# Patient Record
Sex: Male | Born: 1960 | Race: White | Hispanic: No | Marital: Married | Smoking: Never smoker
Health system: Southern US, Community
[De-identification: ages and names within clinical notes are randomized; demographics above are authoritative.]

## PROBLEM LIST (undated history)

## (undated) DIAGNOSIS — E669 Obesity, unspecified: Secondary | ICD-10-CM

## (undated) DIAGNOSIS — C801 Malignant (primary) neoplasm, unspecified: Secondary | ICD-10-CM

## (undated) DIAGNOSIS — S0530XA Ocular laceration without prolapse or loss of intraocular tissue, unspecified eye, initial encounter: Secondary | ICD-10-CM

## (undated) HISTORY — PX: EYE SURGERY: SHX253

## (undated) HISTORY — DX: Obesity, unspecified: E66.9

## (undated) HISTORY — DX: Malignant (primary) neoplasm, unspecified: C80.1

## (undated) HISTORY — DX: Ocular laceration without prolapse or loss of intraocular tissue, unspecified eye, initial encounter: S05.30XA

## (undated) HISTORY — PX: OTHER SURGICAL HISTORY: SHX169

---
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
## 2007-03-14 ENCOUNTER — Emergency Department: Payer: Self-pay | Admitting: Emergency Medicine

## 2008-12-05 ENCOUNTER — Emergency Department: Payer: Self-pay | Admitting: Emergency Medicine

## 2008-12-15 ENCOUNTER — Ambulatory Visit: Payer: Self-pay | Admitting: Emergency Medicine

## 2009-01-02 ENCOUNTER — Ambulatory Visit: Payer: Self-pay | Admitting: Pain Medicine

## 2009-01-19 ENCOUNTER — Ambulatory Visit: Payer: Self-pay | Admitting: Physician Assistant

## 2010-07-27 ENCOUNTER — Ambulatory Visit: Payer: Self-pay | Admitting: Unknown Physician Specialty

## 2010-07-27 LAB — HM COLONOSCOPY

## 2011-04-01 ENCOUNTER — Ambulatory Visit: Payer: Self-pay | Admitting: General Practice

## 2012-10-21 HISTORY — PX: TOTAL HIP ARTHROPLASTY: SHX124

## 2013-02-17 ENCOUNTER — Ambulatory Visit: Payer: Self-pay | Admitting: Pain Medicine

## 2013-02-18 ENCOUNTER — Ambulatory Visit: Payer: Self-pay | Admitting: Pain Medicine

## 2013-03-04 ENCOUNTER — Emergency Department: Payer: Self-pay | Admitting: Emergency Medicine

## 2013-03-05 ENCOUNTER — Ambulatory Visit: Payer: Self-pay | Admitting: Pain Medicine

## 2013-03-31 ENCOUNTER — Ambulatory Visit: Payer: Self-pay | Admitting: Pain Medicine

## 2013-04-13 ENCOUNTER — Ambulatory Visit: Payer: Self-pay | Admitting: Pain Medicine

## 2013-05-31 ENCOUNTER — Ambulatory Visit: Payer: Self-pay | Admitting: General Practice

## 2013-05-31 LAB — CBC
HCT: 39.2 % — ABNORMAL LOW (ref 40.0–52.0)
MCH: 32.1 pg (ref 26.0–34.0)
MCHC: 35.3 g/dL (ref 32.0–36.0)
MCV: 91 fL (ref 80–100)
Platelet: 164 10*3/uL (ref 150–440)
RBC: 4.31 10*6/uL — ABNORMAL LOW (ref 4.40–5.90)
RDW: 12.6 % (ref 11.5–14.5)
WBC: 6.4 10*3/uL (ref 3.8–10.6)

## 2013-05-31 LAB — BASIC METABOLIC PANEL
Chloride: 107 mmol/L (ref 98–107)
Co2: 28 mmol/L (ref 21–32)
EGFR (African American): >60
EGFR (Non-African Amer.): >60
Glucose: 93 mg/dL (ref 65–99)
Osmolality: 279 (ref 275–301)
Potassium: 4 mmol/L (ref 3.5–5.1)

## 2013-05-31 LAB — URINALYSIS, COMPLETE
Blood: NEGATIVE
Ph: 5 (ref 4.5–8.0)

## 2013-05-31 LAB — PROTIME-INR: Prothrombin Time: 13.1 secs (ref 11.5–14.7)

## 2013-05-31 LAB — MRSA PCR SCREENING

## 2013-06-14 ENCOUNTER — Inpatient Hospital Stay: Payer: Self-pay | Admitting: General Practice

## 2013-06-15 LAB — BASIC METABOLIC PANEL WITH GFR
Anion Gap: 5 — ABNORMAL LOW
BUN: 9 mg/dL
Calcium, Total: 8.4 mg/dL — ABNORMAL LOW
Chloride: 105 mmol/L
Co2: 26 mmol/L
Creatinine: 0.91 mg/dL
EGFR (African American): >60
EGFR (Non-African Amer.): >60
Glucose: 124 mg/dL — ABNORMAL HIGH
Osmolality: 272
Potassium: 4.3 mmol/L
Sodium: 136 mmol/L

## 2013-06-15 LAB — HEMOGLOBIN: HGB: 11.9 g/dL — ABNORMAL LOW (ref 13.0–18.0)

## 2013-06-15 LAB — PLATELET COUNT: Platelet: 178 10*3/uL (ref 150–440)

## 2013-06-16 LAB — BASIC METABOLIC PANEL WITH GFR
Anion Gap: 5 — ABNORMAL LOW
BUN: 11 mg/dL
Calcium, Total: 8.7 mg/dL
Chloride: 102 mmol/L
Co2: 27 mmol/L
Creatinine: 0.95 mg/dL
EGFR (African American): >60
EGFR (Non-African Amer.): >60
Glucose: 105 mg/dL — ABNORMAL HIGH
Osmolality: 268
Potassium: 4.2 mmol/L
Sodium: 134 mmol/L — ABNORMAL LOW

## 2013-06-16 LAB — PATHOLOGY REPORT

## 2013-06-16 LAB — HEMOGLOBIN: HGB: 12.4 g/dL — ABNORMAL LOW

## 2014-07-31 ENCOUNTER — Emergency Department: Payer: Self-pay | Admitting: Emergency Medicine

## 2015-02-10 NOTE — Discharge Summary (Signed)
PATIENT NAME:  Thomas Durham, Thomas Durham MR#:  [id number] DATE OF BIRTH:  05/30/1961  DATE OF ADMISSION:  06/14/2013 DATE OF DISCHARGE:  06/16/2013   DICTATING FOR: Jeneen Rinks P. Holley Bouche., MD  ADMITTING DIAGNOSIS: Degenerative arthrosis of right hip.   DISCHARGE DIAGNOSIS: Degenerative arthrosis of right hip.   HISTORY: The patient is a pleasant 54 year old gentleman who has been followed at [facility] for progression of right hip pain. He had a chronic history of low back pain with radicular symptoms and has been treated by Dr. Dossie Arbour with epidural steroid injections. Recently, he has been having increased right hip pain for which the epidural steroid injections did not give any relief. MRI was performed on the right hip, which subsequently showed findings consistent with severe avascular necrosis as well as collapse of the superior acetabulum at the articular surface. The patient was noted to have milder changes on the MRI of avascular necrosis to the right femoral head. The patient had reported progressive difficulty with his right hip range of motion and was having problems with doffing and donning of his socks. Arising from a seated position tended to aggravate his pain. He was reporting night pain as well. He had not seen any significant improvement in his condition despite nonsteroidal anti-inflammatory medications as well as activity modification. His pain had increased to the point that it was significantly interfering with his activities of daily living. X-rays taken in [facility] showed cystic changes noted to the femoral head with flattening of the superior portion of the femoral head. He was also noted to have significant narrowing of the cartilage space. After discussion of the risks and benefits of surgical intervention, the patient expressed his understanding of the risks and benefits and agreed for plans for surgical intervention.   PROCEDURE: Right total hip arthroplasty.   ANESTHESIA:  Spinal.   IMPLANTS UTILIZED: DePuy 13.5 mm small stature AML femoral component, 54 mm outer diameter Pinnacle 100 acetabular component, +4 mm neutral Pinnacle Marathon polyethylene liner, 36 mm M-Spec femoral head with a +1.5 mm neck length.   HOSPITAL COURSE: The patient tolerated the procedure very well. He had no complications. He was then taken to the PACU, where he was stabilized and then transferred to the orthopedic floor. The patient began receiving anticoagulation therapy of Lovenox 30 mg subcutaneous q.12 hours per anesthesia and pharmacy protocol. He was fitted with TED stockings bilaterally. These were allowed to be removed 1 hour per 8-hour shift. He was also fitted with the AVI compression foot pumps bilaterally set at 80 mmHg. His calves have been nontender. There has been no evidence of any DVTs. Negative Homans sign. Heels were elevated off the bed using rolled towels.   The patient has denied any chest pain or any shortness of breath. Vital signs have been stable. He has been afebrile. Hemodynamically, he is stable. No transfusions were given.   Physical therapy was initiated on day 1 for gait training and transfers. On day 1, he was ambulating 150 feet. Upon being discharged, he was ambulating greater than 200 feet. He was able go up and down 4 sets of steps. He was independent with bed-to-chair transfers. He could recall the 4 precautions of posterior hip precautions. Occupational therapy was also initiated on day 1 for ADLs and assistive devices.   The patient is discharged to home in improved stable condition.   DISCHARGE INSTRUCTIONS:  1. He may weight bear as tolerated. Continue using a walker until cleared by physical therapy to  go to a quad cane.  2. He will receive home health PT.  3. Elevate the heels off the bed.  4. Continue with TED stockings. These are to be worn during the day, but may be removed at night. Incentive spirometer q.1 hour while awake.  5. He was placed  on a regular diet.  6. He is not to get the dressing wet. He is not to take a shower until the staples are removed in 2 weeks on 06/28/2013.  7. He is to call the clinic sooner if any temperatures of 101.5 or greater or excessive bleeding. 8. He has a followup appointment in [facility] on October 7th at 2:45 with Dr. Skip Estimable.  9. The patient is to resume his regular medications he was on prior to admission. He was given a prescription for oxycodone 5 to 10 mg q.4-6 hours p.r.n. for pain and Lovenox 40 mg subcutaneously daily for 14 days, then discontinue and begin taking one 81 mg enteric-coated aspirin.   PAST MEDICAL HISTORY: Chronic low back pain.   ____________________________ Vance Peper, PA jrw:OSi D: 06/16/2013 06:32:44 ET T: 06/16/2013 07:20:28 ET JOB#: [id number]  cc: Vance Peper, PA, <Dictator> Ziaire Bieser PA ELECTRONICALLY SIGNED 06/17/2013 22:08

## 2015-02-10 NOTE — Op Note (Signed)
PATIENT NAME:  Thomas Durham, Thomas Durham MR#:  [id number] DATE OF BIRTH:  1960-12-16  DATE OF PROCEDURE:  06/14/2013  PREOPERATIVE DIAGNOSIS: Degenerative arthrosis of the right hip secondary to avascular necrosis of the right femoral head.   POSTOPERATIVE DIAGNOSIS: Degenerative arthrosis of the right hip secondary to avascular necrosis of the right femoral head (pathology pending).   PROCEDURE PERFORMED: Right total hip arthroplasty.   SURGEON: Laurice Record. Hooten, M.D.   ASSISTANT:  Vance Peper, PA (Required to maintain retraction throughout the procedure.)   ANESTHESIA: Spinal.   ESTIMATED BLOOD LOSS: 200 mL.   FLUIDS REPLACED: 2000 mL of crystalloid.   DRAINS: Two medium drains to Hemovac reservoir.   IMPLANTS UTILIZED: DePuy 13.5 mm small stature AML femoral stem, 54 mm outer diameter Pinnacle 100 acetabular component, +4 mm neutral Pinnacle Marathon polyethylene liner, 36 mm M-Spec femoral head with a +1.5 mm neck length.   INDICATIONS FOR SURGERY: The patient is a 54 year old gentleman who has been seen for complaints of progressive right hip and groin pain. Radiographs demonstrated significant degenerative changes with evidence of avascular necrosis of the femoral head. He did not see any significant improvement despite conservative nonsurgical intervention. After discussion of the risks and benefits of surgical intervention, the patient expressed understanding of the risks and benefits and agreed with plans for surgical intervention.   PROCEDURE IN DETAIL: The patient the operating room and, after adequate spinal anesthesia was achieved, the patient was placed in the left lateral decubitus position. Axillary roll was placed and all bony prominences were well padded. The patient's right hip and leg were cleaned and prepped with ChloraPrep and draped in the usual sterile fashion. A "timeout" was performed as per usual protocol. A lateral curvilinear incision was made, gently curving towards the  posterior superior iliac spine. IT band was incised in line with the skin incision and fibers of gluteus maximus were split in line. Piriformis tendon was identified, skeletonized, and incised at its insertion on the proximal femur and reflected posteriorly. In a similar fashion, short external rotators were incised and reflected posteriorly. A T-type posterior capsulotomy was performed. Prior to dislocation of the femoral head, a threaded Steinmann pin was inserted through a separate stab incision into the pelvis superior to the acetabulum and bent in the form of a stylus so as to assess limb length and hip offset throughout the procedure. The femoral head was then dislocated posteriorly. Inspection of the femoral head demonstrated a flap of articular cartilage with obvious insufficiency of the subchondral bone. Significant degenerative changes were noted with prominent osteophytes. The femoral neck cut was performed using an oscillating saw. The anterior capsule was elevated off the femoral neck. Inspection of the acetabulum also demonstrated significant degenerative changes. Remnant of the labrum was excised. The acetabulum was reamed in a sequential fashion up to a 53 mm diameter. This allowed for good punctate bleeding bone. A 54 mm Pinnacle 100 acetabular component was positioned and impacted into place. Excellent scratch fit was appreciated. A +4 neutral polyethylene trial was inserted and attention was directed to the proximal femur. Pilot hole for reaming of the proximal femoral canal was created using a high-speed burr. The proximal femoral canal was then reamed in a sequential fashion up to a 13 mm diameter. This allowed for 6.5 cm of scratch fit. The proximal femur was then prepared using a 13.5 mm aggressive side-biting reamer. Serial broaches were inserted up to a 13.5 mm small stature broach. The calcar region was planed and  trial reduction was performed with a 36 mm hip ball with a +1.5 mm neck  length. This allowed for good equalization of limb lengths and appropriate offset. Excellent stability was appreciated both anteriorly and posteriorly. The femoral trials were removed. The polyethylene trial was removed. The metal shell was irrigated with copious amounts of normal saline with antibiotic solution and then suctioned dry. A +4 mm neutral Pinnacle Marathon polyethylene liner was positioned and impacted into place. Next, a 13.5 mm small stature AML femoral stem was positioned and impacted into place. Excellent scratch fit was appreciated. Trial reduction was again performed with a 36 mm ball with a +1.5 mm neck length. Again, excellent equalization of limb lengths and hip offset was appreciated and excellent stability was noted both anteriorly and posteriorly. The femoral head was dislocated posteriorly. Morse taper was cleaned and dried and a 36 mm outer diameter M-Spec femoral head with a +1.5 mm neck length was placed on the trunnion and impacted into place. The hip was reduced and placed through a range of motion with excellent stability appreciated. The wound was irrigated with copious amounts of normal saline with antibiotic solution using pulsatile lavage and then suctioned dry. The posterior capsulotomy was repaired using #5 Ethibond. The was reapproximated on the undersurface of the gluteus medius tendon using #5 Ethibond. The gluteal sling which had been released to better mobilize the proximal femur was also repaired using #5 Ethibond. Two medium drains were placed in the wound bed and brought out through a separate stab incision to be attached to Hemovac reservoir. IT band was repaired using #1 Vicryl. The subcutaneous tissue was approximated in layers using first #0 Vicryl followed by 2-0 Vicryl. Skin was closed with skin staples. Sterile dressing was applied. The patient tolerated the procedure well. He was transported to the recovery room in stable  condition.   ____________________________ Laurice Record. Holley Bouche., MD jph:dp D: 06/14/2013 21:47:28 ET T: 06/15/2013 07:46:32 ET JOB#: [id number]  cc: Laurice Record. Holley Bouche., MD, <Dictator> JAMES P Holley Bouche MD ELECTRONICALLY SIGNED 06/30/2013 7:13

## 2015-07-26 ENCOUNTER — Encounter: Payer: Self-pay | Admitting: Physician Assistant

## 2015-07-26 ENCOUNTER — Ambulatory Visit: Payer: Self-pay | Admitting: Physician Assistant

## 2015-07-26 VITALS — BP 139/85 | HR 90 | Temp 98.8°F | Ht 68.0 in | Wt 259.0 lb

## 2015-07-26 DIAGNOSIS — E669 Obesity, unspecified: Secondary | ICD-10-CM

## 2015-07-26 DIAGNOSIS — Z713 Dietary counseling and surveillance: Secondary | ICD-10-CM

## 2015-07-26 MED ORDER — PHENTERMINE HCL 37.5 MG PO CAPS: 37.5000 mg | ORAL_CAPSULE | ORAL | Status: DC

## 2015-07-26 NOTE — Progress Notes (Signed)
S: here for weight check and med refill, has tolerated medication well, no cp/sob, has been dieting and adding exercise into daily routine, weight on last visit was 269lbs  O: vitals wnl, weight was 258lbs, lungs c t a, cv rrr, no mrg; n/v intact  A: weight loss consultation  P: continue phentermine 37.5mg  1 po qd for another month, will recheck weight , bp in 1 month

## 2015-10-03 ENCOUNTER — Ambulatory Visit: Payer: Self-pay | Admitting: Physician Assistant

## 2015-10-03 ENCOUNTER — Encounter: Payer: Self-pay | Admitting: Physician Assistant

## 2015-10-03 VITALS — BP 120/80 | HR 84 | Temp 98.7°F

## 2015-10-03 DIAGNOSIS — J018 Other acute sinusitis: Secondary | ICD-10-CM

## 2015-10-03 MED ORDER — FLUTICASONE PROPIONATE 50 MCG/ACT NA SUSP: 2.0000 | Freq: Every day | NASAL | Status: DC

## 2015-10-03 MED ORDER — AZITHROMYCIN 250 MG PO TABS: ORAL_TABLET | ORAL | Status: DC

## 2015-10-03 NOTE — Progress Notes (Signed)
S: C/o runny nose and congestion for 5 days, no fever, chills, cp/sob, v/d; mucus is green and thick, cough is sporadic, c/o of facial and dental pain. A lot of sneezing  Using otc meds:   O: PE: vitals wnl, nad, normocephalic, tms dull, nasal mucosa red and swollen, throat injected, neck supple no lymph, lungs c t a, cv rrr, neuro intact  A:  Acute sinusitis   P: zpack, flonase; drink fluids, continue regular meds , use otc meds of choice, return if not improving in 5 days, return earlier if worsening

## 2016-05-03 ENCOUNTER — Encounter: Payer: Self-pay | Admitting: Physician Assistant

## 2016-05-03 ENCOUNTER — Ambulatory Visit: Payer: Self-pay | Admitting: Physician Assistant

## 2016-05-03 VITALS — BP 139/80 | HR 68 | Temp 98.5°F | Ht 68.0 in | Wt 271.0 lb

## 2016-05-03 DIAGNOSIS — Z299 Encounter for prophylactic measures, unspecified: Secondary | ICD-10-CM

## 2016-05-03 NOTE — Progress Notes (Signed)
   Subjective:Physical Exam    Patient ID: Thomas Durham, male    DOB: [birth year], 55 y.o.   MRN: [id number]  HPI Patient here for annual exam. Concern for weight gain.    Review of Systems Right hip replacement. Right Eye Surgery    Objective:   Physical Exam No acute Distress. VSS. Obesity. HEENT unremarkable. Neck supple without adenopathy. Lungs CTA with equal expansion. Heart RRR. Obese abdomen. Neg HSM. Normoactive bowel Sounds. No spinal deformity. F/E ROM of cervical and Lumbar spine. No extremity deformity. N/V intact with F/E ROM. Strength 5/5. CNII-XII grossly intact.       Assessment & Plan:Well Exam. Obesity  Follow up for Lab Result.

## 2016-05-04 LAB — CMP12+LP+TP+TSH+6AC+PSA+CBC…
A/G RATIO: 1.4 (ref 1.2–2.2)
ALT: 39 IU/L (ref 0–44)
AST: 26 IU/L (ref 0–40)
Albumin: 4 g/dL (ref 3.5–5.5)
Alkaline Phosphatase: 68 IU/L (ref 39–117)
BUN/Creatinine Ratio: 16 (ref 9–20)
BUN: 15 mg/dL (ref 6–24)
Basophils Absolute: 0 10*3/uL (ref 0.0–0.2)
Basos: 1 %
Bilirubin Total: 0.7 mg/dL (ref 0.0–1.2)
CHLORIDE: 102 mmol/L (ref 96–106)
CHOL/HDL RATIO: 2.7 ratio (ref 0.0–5.0)
CREATININE: 0.94 mg/dL (ref 0.76–1.27)
Calcium: 8.7 mg/dL (ref 8.7–10.2)
Cholesterol, Total: 127 mg/dL (ref 100–199)
EOS (ABSOLUTE): 0.6 10*3/uL — AB (ref 0.0–0.4)
Eos: 8 %
Free Thyroxine Index: 2.1 (ref 1.2–4.9)
GFR calc Af Amer: 104 mL/min/{1.73_m2} (ref >59)
GFR calc non Af Amer: 90 mL/min/{1.73_m2} (ref >59)
GGT: 23 IU/L (ref 0–65)
GLUCOSE: 106 mg/dL — AB (ref 65–99)
Globulin, Total: 2.8 g/dL (ref 1.5–4.5)
HDL: 47 mg/dL (ref >39)
HEMATOCRIT: 40.2 % (ref 37.5–51.0)
Hemoglobin: 13.5 g/dL (ref 12.6–17.7)
IMMATURE GRANS (ABS): 0.1 10*3/uL (ref 0.0–0.1)
IMMATURE GRANULOCYTES: 1 %
Iron: 132 ug/dL (ref 38–169)
LDH: 227 IU/L — AB (ref 121–224)
LDL Calculated: 68 mg/dL (ref 0–99)
LYMPHS: 28 %
Lymphocytes Absolute: 2 10*3/uL (ref 0.7–3.1)
MCH: 30.7 pg (ref 26.6–33.0)
MCHC: 33.6 g/dL (ref 31.5–35.7)
MCV: 91 fL (ref 79–97)
MONOCYTES: 7 %
MONOS ABS: 0.5 10*3/uL (ref 0.1–0.9)
Neutrophils Absolute: 4.1 10*3/uL (ref 1.4–7.0)
Neutrophils: 55 %
PHOSPHORUS: 3.7 mg/dL (ref 2.5–4.5)
Platelets: 199 10*3/uL (ref 150–379)
Potassium: 4.2 mmol/L (ref 3.5–5.2)
Prostate Specific Ag, Serum: 1.4 ng/mL (ref 0.0–4.0)
RBC: 4.4 x10E6/uL (ref 4.14–5.80)
RDW: 13.1 % (ref 12.3–15.4)
Sodium: 139 mmol/L (ref 134–144)
T3 Uptake Ratio: 29 % (ref 24–39)
T4 TOTAL: 7.2 ug/dL (ref 4.5–12.0)
TOTAL PROTEIN: 6.8 g/dL (ref 6.0–8.5)
TSH: 2.96 u[IU]/mL (ref 0.450–4.500)
Triglycerides: 62 mg/dL (ref 0–149)
URIC ACID: 6.5 mg/dL (ref 3.7–8.6)
VLDL CHOLESTEROL CAL: 12 mg/dL (ref 5–40)
WBC: 7.3 10*3/uL (ref 3.4–10.8)

## 2016-05-04 LAB — HCV COMMENT:

## 2016-05-04 LAB — VITAMIN D 25 HYDROXY (VIT D DEFICIENCY, FRACTURES): Vit D, 25-Hydroxy: 41.6 ng/mL (ref 30.0–100.0)

## 2016-05-04 LAB — HEPATITIS C ANTIBODY (REFLEX): HCV Ab: <0.1 s/co ratio (ref 0.0–0.9)

## 2016-05-08 LAB — SPECIMEN STATUS REPORT

## 2016-05-08 LAB — HGB A1C W/O EAG: HEMOGLOBIN A1C: 5.4 % (ref 4.8–5.6)

## 2016-09-17 ENCOUNTER — Encounter: Payer: Self-pay | Admitting: Physician Assistant

## 2016-09-17 ENCOUNTER — Ambulatory Visit: Payer: Self-pay | Admitting: Physician Assistant

## 2016-09-17 VITALS — BP 150/80 | HR 84 | Temp 98.5°F | Ht 68.0 in | Wt 275.0 lb

## 2016-09-17 DIAGNOSIS — E6609 Other obesity due to excess calories: Secondary | ICD-10-CM

## 2016-09-17 MED ORDER — PHENTERMINE HCL 37.5 MG PO CAPS: 37.5000 mg | ORAL_CAPSULE | ORAL | 0 refills | Status: DC

## 2016-09-17 NOTE — Progress Notes (Signed)
S: states he needs to lose weight, was on phentermine over a year ago and lost some weight , went from 269 to 258, then stopped taking the meds and started eating bad again, now he weighs more than he ever has, denies cp/sob  O: vitals w bp 150/80, weight 275, lungs c t a, cv rrr no mrg  A: obesity  P: phentermine 37.5 mg qd, weight loss diet discussed, exercise, take otc cinnamon for bp, will recheck in one month

## 2016-09-24 ENCOUNTER — Ambulatory Visit: Payer: Self-pay | Admitting: Physician Assistant

## 2016-09-24 ENCOUNTER — Encounter: Payer: Self-pay | Admitting: Physician Assistant

## 2016-09-24 VITALS — BP 130/70 | HR 102 | Temp 98.7°F

## 2016-09-24 DIAGNOSIS — J069 Acute upper respiratory infection, unspecified: Secondary | ICD-10-CM

## 2016-09-24 MED ORDER — CEFDINIR 300 MG PO CAPS: 300.0000 mg | ORAL_CAPSULE | Freq: Two times a day (BID) | ORAL | 0 refills | Status: DC

## 2016-09-24 NOTE — Progress Notes (Signed)
S: C/o runny nose and congestion for 3 days, no fever, +chills,  No cp/sob, v/d; mucus was green/brown this am cough is sporadic, is achy, states he sat out by the fire on Friday and sx started on Saturday Using otc meds: mucinex  O: PE: vitals wnl, nad, perrl eomi, normocephalic, tms dull, nasal mucosa red and swollen, throat injected, neck supple no lymph, lungs c t a, cv rrr, neuro intact  A:  Acute uri   P: drink fluids, continue regular meds , use otc meds of choice, return if not improving in 5 days, return earlier if worsening , omnicef, mucinex

## 2016-10-22 ENCOUNTER — Ambulatory Visit: Payer: Self-pay | Admitting: Physician Assistant

## 2016-10-22 ENCOUNTER — Encounter: Payer: Self-pay | Admitting: Physician Assistant

## 2016-10-22 VITALS — BP 140/80 | HR 83 | Temp 98.3°F | Wt 260.0 lb

## 2016-10-22 DIAGNOSIS — Z713 Dietary counseling and surveillance: Secondary | ICD-10-CM

## 2016-10-22 MED ORDER — PHENTERMINE HCL 37.5 MG PO CAPS: 37.5000 mg | ORAL_CAPSULE | ORAL | 0 refills | Status: DC

## 2016-10-22 NOTE — Progress Notes (Signed)
S: here for recheck, weighed 275lb on last visit, today is down to 260lb, states he has been watching his diet and is exercising via hunting, no regular exercise, no cp/sob  O: vitals wnl, nad, weight 260lb, lungs c t a, cv rrr  A: weight loss consult, obesity  P: phentermine 37.5mg  qd #30 nr

## 2017-01-24 ENCOUNTER — Encounter: Payer: Self-pay | Admitting: Physician Assistant

## 2017-01-24 ENCOUNTER — Ambulatory Visit: Payer: Self-pay | Admitting: Physician Assistant

## 2017-01-24 VITALS — BP 140/80 | HR 75 | Temp 97.9°F | Ht 68.0 in | Wt 264.0 lb

## 2017-01-24 DIAGNOSIS — Z0000 Encounter for general adult medical examination without abnormal findings: Secondary | ICD-10-CM

## 2017-01-24 NOTE — Progress Notes (Signed)
   Subjective:Biometric Exam    Patient ID: Thomas Durham, male    DOB: 1960/11/21, 56 y.o.   MRN: [id number]  HPI Patient for Biometric exam.   Review of Systems Right hip replacement.    Objective:   Physical Exam See Vital sing. HEENT unremarkable. Neck supple w/o adenopathy. Lungs CTA and Heart RRR. Obese abdomen, Normoactive bowel sounds, and SNTTP.  No upper/lower limb deformity. Surgical scar right. No spinal deformity. No CVA guarding. CNII-XII grossly intact.       Assessment & Plan:Well exam  Follow up post lab results.

## 2017-01-24 NOTE — Addendum Note (Signed)
Addended by: Rudene Anda T on: 01/24/2017 12:52 PM   Modules accepted: Orders

## 2017-01-25 LAB — CMP12+LP+TP+TSH+6AC+PSA+CBC…
ALK PHOS: 66 IU/L (ref 39–117)
ALT: 25 IU/L (ref 0–44)
AST: 25 IU/L (ref 0–40)
Albumin/Globulin Ratio: 1.6 (ref 1.2–2.2)
Albumin: 4.2 g/dL (ref 3.5–5.5)
BASOS ABS: 0 10*3/uL (ref 0.0–0.2)
BILIRUBIN TOTAL: 0.7 mg/dL (ref 0.0–1.2)
BUN / CREAT RATIO: 13 (ref 9–20)
BUN: 12 mg/dL (ref 6–24)
Basos: 0 %
CHLORIDE: 103 mmol/L (ref 96–106)
CHOL/HDL RATIO: 2.7 ratio (ref 0.0–5.0)
CREATININE: 0.95 mg/dL (ref 0.76–1.27)
Calcium: 9 mg/dL (ref 8.7–10.2)
Cholesterol, Total: 136 mg/dL (ref 100–199)
EOS (ABSOLUTE): 0.4 10*3/uL (ref 0.0–0.4)
EOS: 6 %
Free Thyroxine Index: 2 (ref 1.2–4.9)
GFR, EST AFRICAN AMERICAN: 103 mL/min/{1.73_m2} (ref >59)
GFR, EST NON AFRICAN AMERICAN: 89 mL/min/{1.73_m2} (ref >59)
GGT: 17 IU/L (ref 0–65)
GLUCOSE: 113 mg/dL — AB (ref 65–99)
Globulin, Total: 2.7 g/dL (ref 1.5–4.5)
HDL: 51 mg/dL (ref >39)
HEMATOCRIT: 41.8 % (ref 37.5–51.0)
HEMOGLOBIN: 14.3 g/dL (ref 13.0–17.7)
IMMATURE GRANS (ABS): 0 10*3/uL (ref 0.0–0.1)
IRON: 129 ug/dL (ref 38–169)
Immature Granulocytes: 1 %
LDH: 207 IU/L (ref 121–224)
LDL CALC: 75 mg/dL (ref 0–99)
LYMPHS: 24 %
Lymphocytes Absolute: 1.6 10*3/uL (ref 0.7–3.1)
MCH: 30.2 pg (ref 26.6–33.0)
MCHC: 34.2 g/dL (ref 31.5–35.7)
MCV: 88 fL (ref 79–97)
MONOCYTES: 7 %
Monocytes Absolute: 0.5 10*3/uL (ref 0.1–0.9)
NEUTROS ABS: 4.1 10*3/uL (ref 1.4–7.0)
Neutrophils: 62 %
POTASSIUM: 4.5 mmol/L (ref 3.5–5.2)
Phosphorus: 3.4 mg/dL (ref 2.5–4.5)
Platelets: 197 10*3/uL (ref 150–379)
Prostate Specific Ag, Serum: 1.6 ng/mL (ref 0.0–4.0)
RBC: 4.73 x10E6/uL (ref 4.14–5.80)
RDW: 13.3 % (ref 12.3–15.4)
Sodium: 142 mmol/L (ref 134–144)
T3 Uptake Ratio: 28 % (ref 24–39)
T4, Total: 7.1 ug/dL (ref 4.5–12.0)
TOTAL PROTEIN: 6.9 g/dL (ref 6.0–8.5)
TSH: 2.19 u[IU]/mL (ref 0.450–4.500)
Triglycerides: 51 mg/dL (ref 0–149)
URIC ACID: 6 mg/dL (ref 3.7–8.6)
VLDL CHOLESTEROL CAL: 10 mg/dL (ref 5–40)
WBC: 6.5 10*3/uL (ref 3.4–10.8)

## 2017-01-25 LAB — HCV COMMENT:

## 2017-01-25 LAB — VITAMIN D 25 HYDROXY (VIT D DEFICIENCY, FRACTURES): VIT D 25 HYDROXY: 21.5 ng/mL — AB (ref 30.0–100.0)

## 2017-01-25 LAB — HIV ANTIBODY (ROUTINE TESTING W REFLEX): HIV Screen 4th Generation wRfx: NONREACTIVE

## 2017-01-25 LAB — HEPATITIS C ANTIBODY (REFLEX): HCV Ab: <0.1 s/co ratio (ref 0.0–0.9)

## 2017-01-29 LAB — HGB A1C W/O EAG: Hgb A1c MFr Bld: 5.5 % (ref 4.8–5.6)

## 2017-01-29 LAB — SPECIMEN STATUS REPORT

## 2017-08-12 ENCOUNTER — Ambulatory Visit: Payer: Self-pay | Admitting: Physician Assistant

## 2017-08-12 DIAGNOSIS — Z299 Encounter for prophylactic measures, unspecified: Secondary | ICD-10-CM

## 2017-08-12 NOTE — Progress Notes (Signed)
Patient came in to get his flu vaccine.

## 2017-10-13 ENCOUNTER — Emergency Department
Admission: EM | Admit: 2017-10-13 | Discharge: 2017-10-13 | Disposition: A | Discharge status: Home / Self Care | Payer: Managed Care, Other (non HMO) | Attending: Emergency Medicine | Admitting: Emergency Medicine

## 2017-10-13 ENCOUNTER — Emergency Department: Payer: Managed Care, Other (non HMO)

## 2017-10-13 ENCOUNTER — Encounter: Payer: Self-pay | Admitting: Emergency Medicine

## 2017-10-13 ENCOUNTER — Other Ambulatory Visit: Payer: Self-pay

## 2017-10-13 DIAGNOSIS — R319 Hematuria, unspecified: Secondary | ICD-10-CM | POA: Diagnosis not present

## 2017-10-13 DIAGNOSIS — N2889 Other specified disorders of kidney and ureter: Secondary | ICD-10-CM | POA: Insufficient documentation

## 2017-10-13 LAB — URINALYSIS, COMPLETE (UACMP) WITH MICROSCOPIC
Bacteria, UA: NONE SEEN
Bilirubin Urine: NEGATIVE
Glucose, UA: NEGATIVE mg/dL
Ketones, ur: NEGATIVE mg/dL
Leukocytes, UA: NEGATIVE
Nitrite: NEGATIVE
Protein, ur: 100 mg/dL — AB
SPECIFIC GRAVITY, URINE: 1.024 (ref 1.005–1.030)
pH: 5 (ref 5.0–8.0)

## 2017-10-13 LAB — BASIC METABOLIC PANEL
Anion gap: 5 (ref 5–15)
BUN: 12 mg/dL (ref 6–20)
CHLORIDE: 106 mmol/L (ref 101–111)
CO2: 29 mmol/L (ref 22–32)
Calcium: 8.9 mg/dL (ref 8.9–10.3)
Creatinine, Ser: 0.87 mg/dL (ref 0.61–1.24)
Glucose, Bld: 94 mg/dL (ref 65–99)
POTASSIUM: 3.7 mmol/L (ref 3.5–5.1)
SODIUM: 140 mmol/L (ref 135–145)

## 2017-10-13 LAB — CBC
HEMATOCRIT: 43 % (ref 40.0–52.0)
Hemoglobin: 14.4 g/dL (ref 13.0–18.0)
MCH: 30.9 pg (ref 26.0–34.0)
MCHC: 33.6 g/dL (ref 32.0–36.0)
MCV: 91.9 fL (ref 80.0–100.0)
PLATELETS: 187 10*3/uL (ref 150–440)
RBC: 4.68 MIL/uL (ref 4.40–5.90)
RDW: 12.9 % (ref 11.5–14.5)
WBC: 8.9 10*3/uL (ref 3.8–10.6)

## 2017-10-13 MED ORDER — IOPAMIDOL (ISOVUE-300) INJECTION 61%
125.0000 mL | Freq: Once | INTRAVENOUS | Status: AC | PRN
  Administered 2017-10-13: 125 mL via INTRAVENOUS

## 2017-10-13 NOTE — ED Provider Notes (Signed)
Ct Abdomen Pelvis W Wo Contrast  Result Date: 10/13/2017 CLINICAL DATA:  56 year old presenting with an episode of painless hematuria this morning. EXAM: CT ABDOMEN AND PELVIS WITHOUT AND WITH CONTRAST TECHNIQUE: Multidetector CT imaging of the abdomen and pelvis was performed following the standard protocol before and following the bolus administration of intravenous contrast. In addition, arterial phase CT images were obtained through the upper abdomen. CONTRAST:  16mL ISOVUE-300 IOPAMIDOL INJECTION 61% IV. COMPARISON:  None. FINDINGS: Lower chest: Respiratory motion blurred images of the lung bases. Heart size normal. Visualized lung bases clear. Hepatobiliary: Diffuse hepatic steatosis without significant focal hepatic parenchymal abnormality. Benign approximate 1 cm cyst arising from the anterior segment right lobe of liver. Gallbladder normal in appearance without calcified gallstones. No biliary ductal dilation. Pancreas: Normal in appearance without evidence of mass, ductal dilation, or inflammation. Spleen: Normal in size and appearance. Adrenals/Urinary Tract: Normal appearing adrenal glands. Solid mass arising from the anterior upper pole and mid left kidney with central necrosis, containing a solitary calcification, maximum measurements approximating 7.3 x 7.1 cm (series 7, image 48). Second solid mass arising posterior to the dominant mass arising from the mid kidney measuring maximally approximately 2.9 x 3.3 cm (series 7, image 44). No evidence of tumor extension into the left renal vein. Normal appearing right kidney. No opaque urinary tract calculi on either side. No evidence of hydronephrosis. Normal appearing decompressed urinary bladder. Stomach/Bowel: Stomach decompressed and normal in appearance. Normal-appearing small bowel. Moderate stool burden throughout normal appearing colon. Mobile cecum positioned in the right mid abdomen. Normal retrocecal appendix in the right upper quadrant  adjacent to the liver tip. Vascular/Lymphatic: Mild atherosclerosis involving the abdominal aorta without evidence of aneurysm. No pathologic lymphadenopathy, including the abdominal retroperitoneum adjacent to the kidneys. Normal sized retroperitoneal lymph nodes are present. Reproductive: Prostate gland partially obscured by the metallic beam hardening streak artifact from the right hip prosthesis. Visualized prostate gland normal in appearance. Seminal vesicles normal. Other: None. Musculoskeletal: Mild degenerative disc disease and spondylosis involving the visualized lower thoracic spine. Minimal degenerative disc disease and spondylosis at L2-3. No acute osseous abnormalities. No evidence of osseous metastatic disease. Anatomic alignment of the right hip prosthesis. IMPRESSION: 1. Two solid masses arising from the left kidney, the largest arising from the upper pole and mid kidney measuring approximately 7 cm and the smaller arising from the mid kidney measuring approximately 3 cm. 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 2. No evidence of metastatic disease involving the abdomen or pelvis. 3. No evidence of tumor extension into the left renal vein. 4. Diffuse hepatic steatosis. Electronically Signed   By: Evangeline Dakin M.D.   On: 10/13/2017 20:36    Carefully reviewed the patient's CT findings, he is absolutely agreeable and will call for close urology follow-up.  He is alert well oriented, he and his wife are in agreement and understand the need for follow-up with urology for confirmatory evaluation and workup for possible kidney cancer.  He does report his urine is now cleared, he does not have any trouble urinating.  He is alert and well-oriented.  Appears appropriate for discharge.  I did discuss with Dr. Jason Coop his CT findings, Dr. Emeline Gins will relay his information to urology to assure follow-up.  The patient will be calling the office on Wednesday  Return precautions and  treatment recommendations and follow-up discussed with the patient who is agreeable with the plan.    Delman Kitten, MD 10/13/17 2232

## 2017-10-13 NOTE — Discharge Instructions (Addendum)
It is VERY important you follow-up with Urology as soon as possible we are quite concerned you have cancer in the kidney however this needs close follow-up and a diagnosis by Urology.

## 2017-10-13 NOTE — ED Notes (Signed)
Pt discharged to home.  Family member driving.  Discharge instructions reviewed.  Verbalized understanding.  No questions or concerns at this time.  Teach back verified.  Pt in NAD.  No items left in ED.   

## 2017-10-13 NOTE — ED Triage Notes (Signed)
Pt reports episode of bright red blood in urine once today. Denies any pain. Denies NVD. Denies any pain or burning upon urination. Ambulatory to triage. No apparent distress noted.

## 2017-10-13 NOTE — ED Provider Notes (Signed)
[facility] Emergency Department Provider Note  ____________________________________________  Time seen: Approximately 7:09 PM  I have reviewed the triage vital signs and the nursing notes.   HISTORY  Chief Complaint Hematuria   HPI PARAM CAPRI is a 56 y.o. male no significant past medical history who presents for evaluation of hematuria. Patient reports several episodes of hematuria since 12 noon today. Color of the urine was lightly red and not red wine color. The urine has cleared by the time he arrived to the emergency room. He denies any abdominal pain, flank pain, fever, chills, nausea, vomiting. Denies any prior episodes of hematuria. Patient is not on any blood thinners. He denies any personal history of cancer. His mother had a history of bladder cancer. Patient is not a smoker.No prior h/o kidney stone.  Past Medical History:  Diagnosis Date  . Obesity   . Ruptured globe     There are no active problems to display for this patient.     Prior to Admission medications   Medication Sig Start Date End Date Taking? Authorizing Provider  Difluprednate 0.05 % EMUL Administer 1 drop to the right eye 6XD. 12/12/14   [provider]    Allergies Amoxicillin and Povidone iodine  Family History  Problem Relation Age of Onset  . Prostate cancer Neg Hx   . Bladder Cancer Neg Hx   . Kidney cancer Neg Hx     Social History Social History   Tobacco Use  . Smoking status: Never Smoker  . Smokeless tobacco: Never Used  Substance Use Topics  . Alcohol use: Yes    Alcohol/week: 0.0 oz  . Drug use: No    Review of Systems  Constitutional: Negative for fever. Eyes: Negative for visual changes. ENT: Negative for sore throat. Neck: No neck pain  Cardiovascular: Negative for chest pain. Respiratory: Negative for shortness of breath. Gastrointestinal: Negative for abdominal pain, vomiting or diarrhea. Genitourinary: Negative for  dysuria. + hematuria Musculoskeletal: Negative for back pain. Skin: Negative for rash. Neurological: Negative for headaches, weakness or numbness. Psych: No SI or HI  ____________________________________________   PHYSICAL EXAM:  VITAL SIGNS: ED Triage Vitals [10/13/17 1528]  Enc Vitals Group     BP (!) 156/92     Pulse Rate 77     Resp 18     Temp 98.2 F (36.8 C)     Temp Source Oral     SpO2 96 %     Weight 270 lb (122.5 kg)     Height 5\' 8"  (1.727 m)     Head Circumference      Peak Flow      Pain Score      Pain Loc      Pain Edu?      Excl. in [place]?     Constitutional: Alert and oriented. Well appearing and in no apparent distress. HEENT:      Head: Normocephalic and atraumatic.         Eyes: Conjunctivae are normal. Sclera is non-icteric.       Mouth/Throat: Mucous membranes are moist.       Neck: Supple with no signs of meningismus. Cardiovascular: Regular rate and rhythm. No murmurs, gallops, or rubs. 2+ symmetrical distal pulses are present in all extremities. No JVD. Respiratory: Normal respiratory effort. Lungs are clear to auscultation bilaterally. No wheezes, crackles, or rhonchi.  Gastrointestinal: Soft, non tender, and non distended with positive bowel sounds. No rebound or guarding. Genitourinary:  No CVA tenderness. Musculoskeletal: Nontender with normal range of motion in all extremities. No edema, cyanosis, or erythema of extremities. Neurologic: Normal speech and language. Face is symmetric. Moving all extremities. No gross focal neurologic deficits are appreciated. Skin: Skin is warm, dry and intact. No rash noted. Psychiatric: Mood and affect are normal. Speech and behavior are normal.  ____________________________________________   LABS (all labs ordered are listed, but only abnormal results are displayed)  Labs Reviewed  URINALYSIS, COMPLETE (UACMP) WITH MICROSCOPIC - Abnormal; Notable for the following components:      Result Value   Color,  Urine AMBER (*)    APPearance CLOUDY (*)    Hgb urine dipstick LARGE (*)    Protein, ur 100 (*)    Squamous Epithelial / LPF 0-5 (*)    All other components within normal limits  BASIC METABOLIC PANEL  CBC   ____________________________________________  EKG  none  ____________________________________________  RADIOLOGY  CT a/p: PND  ____________________________________________   PROCEDURES  Procedure(s) performed: None Procedures Critical Care performed:  None ____________________________________________   INITIAL IMPRESSION / ASSESSMENT AND PLAN / ED COURSE   56 y.o. male no significant past medical history who presents for evaluation of several episodes of painless hematuria today. patient with family history of bladder cancer. Patient looks hemodynamically stable with a normal exam at this time. Will check CBC and BMP. We'll do a CT abdomen and pelvis to rule out any obvious malignancy. UA showing large blood but no evidence of UTI also with 100 protein. If workup here is unrevealing patients can be discharged home and follow-up with urology for cystoscopy and further management.   ED COURSE: CT pending. Labs WNL. Care transferred to Dr. Jacqualine Code.    As part of my medical decision making, I reviewed the following data within the [facility] notes reviewed and incorporated, Labs reviewed , Patient signed out to Dr. Jacqualine Code, Notes from prior ED visits and [place] Controlled Substance Database    Pertinent labs & imaging results that were available during my care of the patient were reviewed by me and considered in my medical decision making (see chart for details).    ____________________________________________   FINAL CLINICAL IMPRESSION(S) / ED DIAGNOSES  Final diagnoses:  Painless hematuria  Renal mass      NEW MEDICATIONS STARTED DURING THIS VISIT:  ED Discharge Orders    None       Note:  This document was prepared using Dragon  voice recognition software and may include unintentional dictation errors.    Rudene Re, MD 10/17/17 2059

## 2017-10-16 ENCOUNTER — Ambulatory Visit
Admission: RE | Admit: 2017-10-16 | Discharge: 2017-10-16 | Disposition: A | Discharge status: Home / Self Care | Payer: Managed Care, Other (non HMO) | Source: Ambulatory Visit | Attending: Urology | Admitting: Urology

## 2017-10-16 ENCOUNTER — Ambulatory Visit: Payer: Managed Care, Other (non HMO) | Admitting: Urology

## 2017-10-16 ENCOUNTER — Encounter: Payer: Self-pay | Admitting: Urology

## 2017-10-16 VITALS — BP 173/82 | HR 85 | Ht 68.0 in | Wt 284.0 lb

## 2017-10-16 DIAGNOSIS — N2889 Other specified disorders of kidney and ureter: Secondary | ICD-10-CM

## 2017-10-16 NOTE — Progress Notes (Signed)
10/16/2017 5:42 PM   Nettie Elm 1961-05-21 [id number]  Referring provider: Shepard General, MD [facility] [place] [address] [place], [place] [address]  Chief Complaint  Patient presents with  . Kidney Mass    New Patient    HPI: Thomas Durham is a 56 year old male who presents for evaluation of total gross painless hematuria.  He presented to the ED on 10/13/2017 with 2 episodes of total gross painless hematuria which cleared within 24 hours.  He denies flank, abdominal, pelvic or scrotal pain.  He denied urinary frequency, urgency, dysuria, hesitancy.  He denies previous history of urologic problems or prior urologic evaluation.  As part of his evaluation in the ED a CT of the abdomen and pelvis with and without contrast was performed which showed a 7 cm left upper pole renal mass with central necrosis and a 3 cm left midpole mass both suspicious for renal cell carcinoma.   PMH: Past Medical History:  Diagnosis Date  . Obesity   . Ruptured globe     Surgical History: Past Surgical History:  Procedure Laterality Date  . EYE SURGERY  2015-16   x5  . TONSILLECTOMY  1977  . TOTAL HIP ARTHROPLASTY  2014    Home Medications:  Allergies as of 10/16/2017      Reactions   Amoxicillin Rash   Povidone Iodine Rash      Medication List        Accurate as of 10/16/17  5:42 PM. Always use your most recent med list.          Difluprednate 0.05 % Emul Administer 1 drop to the right eye 6XD.       Allergies:  Allergies  Allergen Reactions  . Amoxicillin Rash  . Povidone Iodine Rash    Family History: Family History  Problem Relation Age of Onset  . Prostate cancer Neg Hx   . Bladder Cancer Neg Hx   . Kidney cancer Neg Hx     Social History:  reports that  has never smoked. he has never used smokeless tobacco. He reports that he drinks alcohol. He reports that he does not use drugs.  ROS: UROLOGY Frequent Urination?: No Hard to  postpone urination?: No Burning/pain with urination?: No Get up at night to urinate?: Yes Leakage of urine?: No Urine stream starts and stops?: No Trouble starting stream?: No Do you have to strain to urinate?: No Blood in urine?: Yes Urinary tract infection?: No Sexually transmitted disease?: No Injury to kidneys or bladder?: No Painful intercourse?: No Weak stream?: No Erection problems?: No Penile pain?: No  Gastrointestinal Nausea?: No Vomiting?: No Indigestion/heartburn?: No Diarrhea?: No Constipation?: No  Constitutional Fever: No Night sweats?: No Weight loss?: No Fatigue?: No  Skin Skin rash/lesions?: No Itching?: No  Eyes Blurred vision?: No Double vision?: No  Ears/Nose/Throat Sore throat?: No Sinus problems?: No  Hematologic/Lymphatic Swollen glands?: No Easy bruising?: No  Cardiovascular Leg swelling?: No Chest pain?: No  Respiratory Cough?: No Shortness of breath?: No  Endocrine Excessive thirst?: No  Musculoskeletal Back pain?: No Joint pain?: No  Neurological Headaches?: No Dizziness?: No  Psychologic Depression?: No Anxiety?: No  Physical Exam: BP (!) 173/82   Pulse 85   Ht 5\' 8"  (1.727 m)   Wt 284 lb (128.8 kg)   BMI 43.18 kg/m   Constitutional:  Alert and oriented, No acute distress. HEENT: [place] AT, moist mucus membranes.  Trachea midline, no masses. Cardiovascular: No clubbing, cyanosis, or edema.  Respiratory: Normal respiratory effort, no increased work of breathing. GI: Abdomen is soft, nontender, nondistended, no abdominal masses GU: No CVA tenderness.  Skin: No rashes, bruises or suspicious lesions. Lymph: No cervical or inguinal adenopathy. Neurologic: Grossly intact, no focal deficits, moving all 4 extremities. Psychiatric: Normal mood and affect.  Laboratory Data: Lab Results  Component Value Date   WBC 8.9 10/13/2017   HGB 14.4 10/13/2017   HCT 43.0 10/13/2017   MCV 91.9 10/13/2017   PLT 187  10/13/2017    Lab Results  Component Value Date   CREATININE 0.87 10/13/2017    Lab Results  Component Value Date   PSA1 1.6 01/24/2017   PSA1 1.4 05/03/2016    Lab Results  Component Value Date   HGBA1C 5.5 01/24/2017    Urinalysis Lab Results  Component Value Date   APPEARANCEUR CLOUDY (A) 10/13/2017   LEUKOCYTESUR NEGATIVE 10/13/2017   PROTEINUR 100 (A) 10/13/2017   GLUCOSEU NEGATIVE 10/13/2017   RBCU TOO NUMEROUS TO COUNT 10/13/2017   BILIRUBINUR NEGATIVE 10/13/2017   NITRITE NEGATIVE 10/13/2017    Lab Results  Component Value Date   BACTERIA NONE SEEN 10/13/2017    Pertinent Imaging: CT personally reviewed with patient and his wife.  Assessment & Plan:    1. Renal mass We discussed his CT in detail and he was informed of the likelihood of renal cell carcinoma.  He most likely will need radical nephrectomy as partial may be difficult with the second inferior mass.  He specifically requested referral to [facility] for further evaluation and treatment.  A chest x-ray was ordered.  - Chest 1 View; Future - Ambulatory referral to Urology   Abbie Sons, [facility] [address] [place],  [address] [phone]

## 2017-10-17 ENCOUNTER — Telehealth: Payer: Self-pay

## 2017-10-17 NOTE — Telephone Encounter (Signed)
-----   Message from Abbie Sons, MD sent at 10/17/2017  7:10 AM EST ----- Chest x-ray showed no abnormalities

## 2017-10-17 NOTE — Telephone Encounter (Signed)
Patients wife notified

## 2017-10-20 ENCOUNTER — Telehealth: Payer: Self-pay | Admitting: Urology

## 2017-10-20 NOTE — Telephone Encounter (Signed)
Patient's wife, Thomas Durham, left a voice mail message inquiring on the status of the referral to [facility].  Please contact her at [phone].

## 2017-11-03 DIAGNOSIS — C642 Malignant neoplasm of left kidney, except renal pelvis: Secondary | ICD-10-CM | POA: Insufficient documentation

## 2017-12-05 ENCOUNTER — Encounter: Payer: Self-pay | Admitting: Physician Assistant

## 2017-12-05 ENCOUNTER — Ambulatory Visit: Payer: Self-pay | Admitting: Physician Assistant

## 2017-12-05 DIAGNOSIS — T8149XA Infection following a procedure, other surgical site, initial encounter: Secondary | ICD-10-CM

## 2017-12-05 NOTE — Progress Notes (Signed)
   Subjective: Wound check    Patient ID: Thomas Durham, male    DOB: [birth year], 57 y.o.   MRN: [id number]  HPI Patient presents today with drainage from surgical site.  Patient had left kidney removal on 11/11/2017 secondary to a cancerous growth.  Patient has staples removed earlier this week.  The patient noticed yellow greenish drainage yesterday.  Patient states contacted surgical clinic at [facility] and was told due to no fever to monitor condition.  Patient presents today with copious drainage.  Patient denies pain or fever.   Review of Systems Negative except for complaint    Objective:   Physical Exam Left flank reveal at the proximal incision site purulent drainage.  Patient is nontender to palpation.       Assessment & Plan: Skin infection  Advised patient of to contact [facility] surgical clinic today for walk-in appointment today.  Area was cleaned and bandaged prior to departure.

## 2018-01-19 DIAGNOSIS — Z006 Encounter for examination for normal comparison and control in clinical research program: Secondary | ICD-10-CM | POA: Insufficient documentation

## 2018-01-28 ENCOUNTER — Ambulatory Visit: Payer: Self-pay | Admitting: Family Medicine

## 2018-01-28 VITALS — BP 136/80 | HR 69 | Resp 16 | Ht 68.0 in | Wt 252.0 lb

## 2018-01-28 DIAGNOSIS — Z0189 Encounter for other specified special examinations: Principal | ICD-10-CM

## 2018-01-28 DIAGNOSIS — Z008 Encounter for other general examination: Secondary | ICD-10-CM

## 2018-01-28 LAB — GLUCOSE, POCT (MANUAL RESULT ENTRY): POC Glucose: 91 mg/dl (ref 70–99)

## 2018-01-28 NOTE — Progress Notes (Signed)
Subjective: Annual biometrics screening  Patient presents for his annual biometric screening. Patient has a history of renal cell cancer that he reports was removed surgically and patient is currently participating in a clinical trial at [facility] for this.  Patient denies any other medical problems. PCP: None currently. Patient works for the [employer]. Patient denies any other issues or concerns.   Review of Systems Constitutional: Unremarkable.  HEENT: Unremarkable. Gastrointestinal: Unremarkable. Respiratory: Unremarkable.   Cardiovascular: Unremarkable.  ROS otherwise negative.   Objective  Physical Exam General: Awake, alert and oriented. No acute distress. Well developed, hydrated and nourished. Appears stated age.  HEENT: Supple neck without adenopathy. Sclera is non-icteric. The ear canal is clear without discharge. The tympanic membrane is normal in appearance with normal landmarks and cone of light. Nasal mucosa is pink and moist. Oral mucosa is pink and moist. The pharynx is normal in appearance without tonsillar swelling or exudates.  Skin: Skin in warm, dry and intact without rashes or lesions. Appropriate color for ethnicity. Cardiac: Heart rate and rhythm are normal. No murmurs, gallops, or rubs are auscultated.  Respiratory: The chest wall is symmetric and without deformity. No signs of respiratory distress. Lung sounds are clear in all lobes bilaterally without rales, ronchi, or wheezes.  Abdominal: Abdomen is soft, symmetric, and non-tender without distention. No masses, hepatomegaly, or splenomegaly are noted.  Neurological: The patient is awake, alert and oriented to person, place, and time with normal speech.  Memory is normal and thought processes intact. No gait abnormalities are appreciated.  Psychiatric: Appropriate mood and affect.   Assessment Annual biometrics screen  Plan  Lipid panel pending. Encouraged routine visits with primary care provider.   Provided patient with resources to establish care with a new primary care provider and encouraged him to do so soon. Fasting blood sugar is 91 today.

## 2018-01-29 LAB — LIPID PANEL
CHOL/HDL RATIO: 3.3 ratio (ref 0.0–5.0)
Cholesterol, Total: 140 mg/dL (ref 100–199)
HDL: 42 mg/dL (ref >39)
LDL CALC: 86 mg/dL (ref 0–99)
TRIGLYCERIDES: 62 mg/dL (ref 0–149)
VLDL Cholesterol Cal: 12 mg/dL (ref 5–40)

## 2018-05-07 ENCOUNTER — Ambulatory Visit (INDEPENDENT_AMBULATORY_CARE_PROVIDER_SITE_OTHER): Payer: Managed Care, Other (non HMO) | Admitting: Family Medicine

## 2018-05-07 ENCOUNTER — Encounter: Payer: Self-pay | Admitting: Family Medicine

## 2018-05-07 VITALS — BP 140/80 | HR 79 | Temp 98.1°F | Resp 12 | Ht 67.0 in | Wt 244.0 lb

## 2018-05-07 DIAGNOSIS — C642 Malignant neoplasm of left kidney, except renal pelvis: Secondary | ICD-10-CM

## 2018-05-07 DIAGNOSIS — R030 Elevated blood-pressure reading, without diagnosis of hypertension: Secondary | ICD-10-CM | POA: Diagnosis not present

## 2018-05-07 DIAGNOSIS — E669 Obesity, unspecified: Secondary | ICD-10-CM | POA: Diagnosis not present

## 2018-05-07 DIAGNOSIS — Z006 Encounter for examination for normal comparison and control in clinical research program: Secondary | ICD-10-CM | POA: Diagnosis not present

## 2018-05-07 DIAGNOSIS — N529 Male erectile dysfunction, unspecified: Secondary | ICD-10-CM | POA: Diagnosis not present

## 2018-05-07 DIAGNOSIS — Z0000 Encounter for general adult medical examination without abnormal findings: Secondary | ICD-10-CM

## 2018-05-07 NOTE — Patient Instructions (Addendum)
Check out the information at familydoctor.org entitled "Nutrition for Weight Loss: What You Need to Know about Fad Diets" Try to lose between 1-2 pounds per week by taking in fewer calories and burning off more calories You can succeed by limiting portions, limiting foods dense in calories and fat, becoming more active, and drinking 8 glasses of water a day (64 ounces) Don't skip meals, especially breakfast, as skipping meals may alter your metabolism Do not use over-the-counter weight loss pills or gimmicks that claim rapid weight loss A healthy BMI (or body mass index) is between 18.5 and 24.9 You can calculate your ideal BMI at the [employer] website ClubMonetize.fr I'll recommend that you not drink more than 4 drinks per day and no more than 14 per week Try to follow the DASH guidelines (DASH stands for Dietary Approaches to Stop Hypertension). Try to limit the sodium in your diet to no more than 1,500mg  of sodium per day. Certainly try to not exceed 2,000 mg per day at the very most. Do not add salt when cooking or at the table.  Check the sodium amount on labels when shopping, and choose items lower in sodium when given a choice. Avoid or limit foods that already contain a lot of sodium. Eat a diet rich in fruits and vegetables and whole grains, and try to lose weight if overweight or obese   DASH Eating Plan DASH stands for "Dietary Approaches to Stop Hypertension." The DASH eating plan is a healthy eating plan that has been shown to reduce high blood pressure (hypertension). It may also reduce your risk for type 2 diabetes, heart disease, and stroke. The DASH eating plan may also help with weight loss. What are tips for following this plan? General guidelines  Avoid eating more than 2,300 mg (milligrams) of salt (sodium) a day. If you have hypertension, you may need to reduce your sodium intake to 1,500 mg a day.  Limit alcohol intake to no more  than 1 drink a day for nonpregnant women and 2 drinks a day for men. One drink equals 12 oz of beer, 5 oz of wine, or 1 oz of hard liquor.  Work with your health care provider to maintain a healthy body weight or to lose weight. Ask what an ideal weight is for you.  Get at least 30 minutes of exercise that causes your heart to beat faster (aerobic exercise) most days of the week. Activities may include walking, swimming, or biking.  Work with your health care provider or diet and nutrition specialist (dietitian) to adjust your eating plan to your individual calorie needs. Reading food labels  Check food labels for the amount of sodium per serving. Choose foods with less than 5 percent of the Daily Value of sodium. Generally, foods with less than 300 mg of sodium per serving fit into this eating plan.  To find whole grains, look for the word "whole" as the first word in the ingredient list. Shopping  Buy products labeled as "low-sodium" or "no salt added."  Buy fresh foods. Avoid canned foods and premade or frozen meals. Cooking  Avoid adding salt when cooking. Use salt-free seasonings or herbs instead of table salt or sea salt. Check with your health care provider or pharmacist before using salt substitutes.  Do not fry foods. Cook foods using healthy methods such as baking, boiling, grilling, and broiling instead.  Cook with heart-healthy oils, such as olive, canola, soybean, or sunflower oil. Meal planning   Eat a balanced  diet that includes: ? 5 or more servings of fruits and vegetables each day. At each meal, try to fill half of your plate with fruits and vegetables. ? Up to 6-8 servings of whole grains each day. ? Less than 6 oz of lean meat, poultry, or fish each day. A 3-oz serving of meat is about the same size as a deck of cards. One egg equals 1 oz. ? 2 servings of low-fat dairy each day. ? A serving of nuts, seeds, or beans 5 times each week. ? Heart-healthy fats. Healthy  fats called Omega-3 fatty acids are found in foods such as flaxseeds and coldwater fish, like sardines, salmon, and mackerel.  Limit how much you eat of the following: ? Canned or prepackaged foods. ? Food that is high in trans fat, such as fried foods. ? Food that is high in saturated fat, such as fatty meat. ? Sweets, desserts, sugary drinks, and other foods with added sugar. ? Full-fat dairy products.  Do not salt foods before eating.  Try to eat at least 2 vegetarian meals each week.  Eat more home-cooked food and less restaurant, buffet, and fast food.  When eating at a restaurant, ask that your food be prepared with less salt or no salt, if possible. What foods are recommended? The items listed may not be a complete list. Talk with your dietitian about what dietary choices are best for you. Grains Whole-grain or whole-wheat bread. Whole-grain or whole-wheat pasta. Brown rice. [place]. Bulgur. Whole-grain and low-sodium cereals. Pita bread. Low-fat, low-sodium crackers. Whole-wheat flour tortillas. Vegetables Fresh or frozen vegetables (raw, steamed, roasted, or grilled). Low-sodium or reduced-sodium tomato and vegetable juice. Low-sodium or reduced-sodium tomato sauce and tomato paste. Low-sodium or reduced-sodium canned vegetables. Fruits All fresh, dried, or frozen fruit. Canned fruit in natural juice (without added sugar). Meat and other protein foods Skinless chicken or [place]. Ground chicken or [place]. Pork with fat trimmed off. Fish and seafood. Egg whites. Dried beans, peas, or lentils. Unsalted nuts, nut butters, and seeds. Unsalted canned beans. Lean cuts of beef with fat trimmed off. Low-sodium, lean deli meat. Dairy Low-fat (1%) or fat-free (skim) milk. Fat-free, low-fat, or reduced-fat cheeses. Nonfat, low-sodium ricotta or cottage cheese. Low-fat or nonfat yogurt. Low-fat, low-sodium cheese. Fats and oils Soft margarine without trans fats. Vegetable oil.  Low-fat, reduced-fat, or light mayonnaise and salad dressings (reduced-sodium). Canola, safflower, olive, soybean, and sunflower oils. Avocado. Seasoning and other foods Herbs. Spices. Seasoning mixes without salt. Unsalted popcorn and pretzels. Fat-free sweets. What foods are not recommended? The items listed may not be a complete list. Talk with your dietitian about what dietary choices are best for you. Grains Baked goods made with fat, such as croissants, muffins, or some breads. Dry pasta or rice meal packs. Vegetables Creamed or fried vegetables. Vegetables in a cheese sauce. Regular canned vegetables (not low-sodium or reduced-sodium). Regular canned tomato sauce and paste (not low-sodium or reduced-sodium). Regular tomato and vegetable juice (not low-sodium or reduced-sodium). Angie Fava. Olives. Fruits Canned fruit in a light or heavy syrup. Fried fruit. Fruit in cream or butter sauce. Meat and other protein foods Fatty cuts of meat. Ribs. Fried meat. Berniece Salines. Sausage. Bologna and other processed lunch meats. Salami. Fatback. Hotdogs. Bratwurst. Salted nuts and seeds. Canned beans with added salt. Canned or smoked fish. Whole eggs or egg yolks. Chicken or [place] with skin. Dairy Whole or 2% milk, cream, and half-and-half. Whole or full-fat cream cheese. Whole-fat or sweetened yogurt. Full-fat cheese. Nondairy  creamers. Whipped toppings. Processed cheese and cheese spreads. Fats and oils Butter. Stick margarine. Lard. Shortening. Ghee. Bacon fat. Tropical oils, such as coconut, palm kernel, or palm oil. Seasoning and other foods Salted popcorn and pretzels. Onion salt, garlic salt, seasoned salt, table salt, and sea salt. Worcestershire sauce. Tartar sauce. Barbecue sauce. Teriyaki sauce. Soy sauce, including reduced-sodium. Steak sauce. Canned and packaged gravies. Fish sauce. Oyster sauce. Cocktail sauce. Horseradish that you find on the shelf. Ketchup. Mustard. Meat flavorings and  tenderizers. Bouillon cubes. Hot sauce and Tabasco sauce. Premade or packaged marinades. Premade or packaged taco seasonings. Relishes. Regular salad dressings. Where to find more information:  National Heart, Lung, and [place]: https://wilson-eaton.com/  American Heart Association: www.heart.org Summary  The DASH eating plan is a healthy eating plan that has been shown to reduce high blood pressure (hypertension). It may also reduce your risk for type 2 diabetes, heart disease, and stroke.  With the DASH eating plan, you should limit salt (sodium) intake to 2,300 mg a day. If you have hypertension, you may need to reduce your sodium intake to 1,500 mg a day.  When on the DASH eating plan, aim to eat more fresh fruits and vegetables, whole grains, lean proteins, low-fat dairy, and heart-healthy fats.  Work with your health care provider or diet and nutrition specialist (dietitian) to adjust your eating plan to your individual calorie needs. This information is not intended to replace advice given to you by your health care provider. Make sure you discuss any questions you have with your health care provider. Document Released: 09/26/2011 Document Revised: 09/30/2016 Document Reviewed: 09/30/2016 Elsevier Interactive Patient Education  [employer].  Ask your study providers if it would be okay to take medicine for erectile dysfunction: Viagra, Cialis, or Levitra  Cancer-Related Sexual Dysfunction, Male Cancer can affect your sex life before you have treatment, during treatment, and after treatment. Cancer and cancer treatments can change the way you look and how you feel about yourself. They can also cause changes in your sex organs. All of these changes can lower your ability or desire to have sex (sexual dysfunction). How cancer affects you sexually depends on your age, the type of cancer you have, and your treatment. It is important to be honest and open with your cancer care team  about how cancer is affecting your sex life. Knowing what to expect and what treatments are available can help you cope with cancer-related sexual problems. What are the causes? This condition may be caused by:  Emotions, such as fear, anxiety, depression, fatigue, and stress. All of these can reduce your desire and ability to have or enjoy sex.  Radiation therapy to treat cancers of the penis, rectum, prostate, or testicles. This treatment can make it hard to get or keep an erection (erectile dysfunction,or ED) and cause a low sperm count, inability to produce sperm (infertility), or loss of sensation.  Chemotherapy. This treatment can cause nausea, vomiting, and fatigue. Some types can also cause low testosterone, ED, and infertility.  Surgery for cancers of the penis, rectum, prostate, or testicles. Surgery can cause ED and infertility.  Colon and bladder cancer surgery. After this surgery, you may have to empty your bladder or colon through an opening in your abdomen. This may make you self-conscious about your physical appearance and your sexuality.  Hormone medicines that are used to treat some cancers. These medicines can cause ED and loss of sexual desire.  Other medicines used for cancer treatment.  For example, pain medicines and antidepressants can affect sexual desire.  What are the signs or symptoms? Common signs and symptoms of this condition include:  ED.  Loss of interest in sex.  Problems with orgasm and ejaculation.  Pain during sex.  Infertility.  How is this diagnosed? Your health care provider may diagnose this condition by asking you questions from a sexual dysfunction questionnaire. If your health care provider does not ask about sexual dysfunction, bring it up. Ask how cancer treatments might affect you sexually. It is important to be open about any symptoms or concerns you have. Your health care provider may also:  Do a physical exam that includes checking  your penis, testicles, prostate, and rectum. You may also be checked for male breast enlargement (gynecomastia).  Ask about anxiety, depression, and alcohol or drug use.  Do blood tests to check your sex hormone levels.  How is this treated? Treatment depends on the type of sexual dysfunction you have. Some common treatment options include:  Working with a sexual Environmental consultant.  Using an ED drug.  Having penile injection for ED or a penile implant.  Hormone replacement therapy.  Using a condom. This may be recommended if your treatment may affect your partner's pregnancy.  Pelvic muscle physical therapy.  Antidepressant medicine.  Sperm banking. This may be recommended before treatment if infertility is possible after treatment. This involves working with a fertility specialist.  Follow these instructions at home: Lifestyle  Get 30 minutes of moderate-intensity exercise every day.  Maintain a healthy weight. Lose weight if you are overweight.  Limit alcohol intake to 2 drinks per day. One drink equals 12 oz of beer, 5 oz of wine, or 1 oz of hard liquor.  Do not use any products that contain nicotine or tobacco, such as cigarettes and e-cigarettes. If you need help quitting, ask your health care provider. Sexual activity  Be open and honest with your sexual partner about your sexual needs and desires. Remember that sexual intimacy involves more than sex, such as caressing, touching, and holding each other.  Ask your cancer health care provider: ? What type of sex is safe for you. ? If you should practice birth control. General instructions  Take over-the-counter and prescription medicines only as told by your health care provider.  Keep all follow-up visits as told by your health care provider. This is important.  Consider joining a cancer support group. Ask your health care provider to recommend one near you, or find an Archivist. Where to find more  information:  American Cancer Society: www.cancer.[place]: www.cancer.[place] of Clinical Oncology: www.cancer.net Contact a health care provider if:  You have lost your desire or ability to have sex.  You have ED.  You have problems with ejaculation or orgasm.  Fear, anxiety, depression, or stress is interfering with your sex life. Summary  Cancer can affect your sex life before, during, and after treatment.  Cancer can reduce your interest in sex and your ability to have or enjoy sex.  Be open and honest with your cancer care team about problems with sex. Ask your cancer care team how cancer treatment may affect your sex life.  Cancer-related sexual dysfunction can be treated in various ways. Talk with your health care provider about options that might help you. This information is not intended to replace advice given to you by your health care provider. Make sure you discuss any questions you have with  your health care provider. Document Released: 09/20/2016 Document Revised: 09/20/2016 Document Reviewed: 09/20/2016 Elsevier Interactive Patient Education  [employer].

## 2018-05-07 NOTE — Assessment & Plan Note (Signed)
Reviewed labs just done through [facility]

## 2018-05-07 NOTE — Assessment & Plan Note (Signed)
Undergoing treatment through [facility]

## 2018-05-07 NOTE — Progress Notes (Signed)
BP 140/80   Pulse 79   Temp 98.1 F (36.7 C) (Oral)   Resp 12   Ht 5\' 7"  (1.702 m)   Wt 244 lb (110.7 kg)   SpO2 97%   BMI 38.22 kg/m    Subjective:    Patient ID: Thomas Durham, male    DOB: 1961-02-29, 57 y.o.   MRN: [id number]  HPI: Thomas Durham is a 57 y.o. male  Chief Complaint  Patient presents with  . New Patient (Initial Visit)    HPI Patient is here to establish care.  Patient [place] clinic who recommended a PCP follow-up.  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 Has lost over 40 pounds in the last 6 months by calorie counting and being more active. 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 He has had 5 eye surgeries, and one retinal detachment.  [facility]  Obesity; he has lost 40+ pounds over the six months; lifestyle changes, healthier eating and more exercise  Brother has huge tumor on the left side, lung cancer; hanging on  He brought in labs done from the [facility] study and we reviewed those together  He had been getting PSA, lipids through free clinic  Last BP was 130/78 at other clinic; never high; just watching; salads twice a day  He is bothered by ED; going on since surgery for the cancer; not sure if depression from that; needs something; morning erections; erection not as firm; not firm enough for penetration, will lose erection at times  Depression screen PHQ 2/9 05/07/2018  Decreased Interest 0  Down, Depressed, Hopeless 0  PHQ - 2 Score 0    Relevant past medical, surgical, family and social history reviewed Past Medical History:  Diagnosis Date  . Cancer ([place])    left kidney  . Obesity   . Ruptured globe    Past  Surgical History:  Procedure Laterality Date  . EYE SURGERY  2015-16   x5  . removal of kidney Left    due to cancer  . TONSILLECTOMY  1977  . TOTAL HIP ARTHROPLASTY  2014   Family History  Problem Relation Age of Onset  . Cancer Mother   . Lung cancer Mother   . Bladder Cancer Mother   . Hypertension Father   . Stroke Father   . Cancer Brother   . Lung cancer Brother   . Prostate cancer Neg Hx   . Kidney cancer Neg Hx    Social History   Tobacco Use  . Smoking status: Never Smoker  . Smokeless tobacco: Never Used  Substance Use Topics  . Alcohol use: Yes    Alcohol/week: 7.2 - 9.0 oz    Types: 12 - 15 Cans of beer per week  . Drug use: No    Interim medical history since last visit reviewed. Allergies and medications reviewed  Review of Systems Per HPI unless specifically indicated above     Objective:    BP 140/80   Pulse 79   Temp 98.1 F (36.7 C) (Oral)   Resp 12   Ht 5\' 7"  (1.702 m)   Wt 244 lb (110.7 kg)  SpO2 97%   BMI 38.22 kg/m   Wt Readings from Last 3 Encounters:  05/07/18 244 lb (110.7 kg)  01/28/18 252 lb (114.3 kg)  10/16/17 284 lb (128.8 kg)    Physical Exam  Constitutional: He appears well-developed and well-nourished. No distress.  HENT:  Head: Normocephalic and atraumatic.  Eyes: EOM are normal. No scleral icterus.  Neck: No thyromegaly present.  Cardiovascular: Normal rate and regular rhythm.  Pulmonary/Chest: Effort normal and breath sounds normal.  Abdominal: Soft. Bowel sounds are normal. He exhibits no distension.  Musculoskeletal: He exhibits no edema.  Neurological: Coordination normal.  Skin: Skin is warm and dry. No pallor.  Psychiatric: He has a normal mood and affect. His behavior is normal. Judgment and thought content normal.   Results for orders placed or performed in visit on 01/28/18  Lipid Profile  Result Value Ref Range   Cholesterol, Total 140 100 - 199 mg/dL   Triglycerides 62 0 - 149 mg/dL   HDL 42 >39  mg/dL   VLDL Cholesterol Cal 12 5 - 40 mg/dL   LDL Calculated 86 0 - 99 mg/dL   Chol/HDL Ratio 3.3 0.0 - 5.0 ratio  POCT Glucose (CBG)-Manual entry (CPT 82948)  Result Value Ref Range   POC Glucose 91 70 - 99 mg/dl      Assessment & Plan:   Problem List Items Addressed This Visit      Genitourinary   Malignant neoplasm of left kidney (HCC) - Primary    Undergoing treatment through [facility]        Other   Obesity (BMI 30.0-34.9)    He is really working on that      Examination of participant in clinical trial    Reviewed labs just done through [place]       Other Visit Diagnoses    Erectile dysfunction, unspecified erectile dysfunction type       may be related to cancer treatment, advised that we can try PDE5i if he will clear that with his trial doctors; risk of fatal hypotension if given w/nitrates   Elevated blood pressure reading       without history of hypertension; recommended DASH guidelines; he is working on weight loss   Preventative health care       he gets these done through free clinic through the county/city       Follow up plan: Return in about 3 months (around 08/07/2018) for follow-up visit with Dr. Sanda Klein.  An after-visit summary was printed and given to the patient at [place].  Please see the patient instructions which may contain other information and recommendations beyond what is mentioned above in the assessment and plan.  No orders of the defined types were placed in this encounter.   No orders of the defined types were placed in this encounter.

## 2018-05-07 NOTE — Assessment & Plan Note (Signed)
He is really working on that

## 2018-05-14 ENCOUNTER — Telehealth: Payer: Self-pay

## 2018-05-14 MED ORDER — SILDENAFIL CITRATE 100 MG PO TABS
50.0000 mg | ORAL_TABLET | Freq: Every day | ORAL | 3 refills | Status: AC | PRN
Start: 2018-05-14 — End: ?

## 2018-05-14 NOTE — Telephone Encounter (Signed)
Copied from [place] [phone]. Topic: General - Other >> May 14, 2018  2:36 PM Ivar Drape wrote: Reason for CRM:  Patient would like a return call from Dr. Sanda Klein concerning information received from his [place] Team at [facility].

## 2018-05-14 NOTE — Telephone Encounter (Signed)
Noted reviewed, Berton Mount, NP visit today I spoke with patient; he had his 6th treatment today; feeling great They don't bother him at all; few little rashes and loose stools, nothing major that keeps him from doing things Patient says he talked to them and they said the medicine for ED would be fine Risk of combining with nitrates, risk of potential drop in BP which can be fatal No experience [place] I suggested that he talk to his pharmacist about possible side effects since this will be his first time using; get the counseling at the pharmacy; he agrees

## 2018-07-07 DIAGNOSIS — Z79899 Other long term (current) drug therapy: Secondary | ICD-10-CM | POA: Insufficient documentation

## 2018-10-27 ENCOUNTER — Encounter: Payer: Self-pay | Admitting: Emergency Medicine

## 2018-10-27 ENCOUNTER — Ambulatory Visit: Payer: Self-pay | Admitting: Emergency Medicine

## 2018-10-27 VITALS — BP 120/78 | HR 107 | Temp 98.7°F | Resp 14

## 2018-10-27 DIAGNOSIS — R059 Cough, unspecified: Secondary | ICD-10-CM

## 2018-10-27 DIAGNOSIS — J209 Acute bronchitis, unspecified: Secondary | ICD-10-CM

## 2018-10-27 DIAGNOSIS — R05 Cough: Secondary | ICD-10-CM

## 2018-10-27 MED ORDER — AZITHROMYCIN 250 MG PO TABS: ORAL_TABLET | ORAL | 0 refills | Status: DC

## 2018-10-27 MED ORDER — IPRATROPIUM-ALBUTEROL 0.5-2.5 (3) MG/3ML IN SOLN
3.0000 mL | Freq: Once | RESPIRATORY_TRACT | Status: AC
Start: 2018-10-27 — End: 2018-10-27
  Administered 2018-10-27: 3 mL via RESPIRATORY_TRACT

## 2018-10-27 MED ORDER — PREDNISONE 10 MG (21) PO TBPK: ORAL_TABLET | ORAL | 0 refills | Status: DC

## 2018-10-27 NOTE — Patient Instructions (Addendum)
You have bronchitis with wheezing. Here in the office, we gave you a DuoNeb bronchodilator breathing treatment, and your oxygen level improved from 97 to 99%, and your wheezing improved somewhat. Prescriptions sent to [facility]: Z-Pak, which is an antibiotic, and prednisone which is a corticosteroid to help with the wheezing New Prescriptions   AZITHROMYCIN (ZITHROMAX Z-PAK) 250 MG TABLET    Take 2 tablets on day one, then 1 tablet daily on days 2 through 5   PREDNISONE (STERAPRED UNI-PAK 21 TAB) 10 MG (21) TBPK TABLET    Take as directed for 6 days.--Take 6 on day 1, 5 on day 2, 4 on day 3, then 3 tablets on day 4, then 2 tablets on day 5, then 1 on day 6.   Rest and drink plenty of fluids. Tylenol if needed for pain or fever. Please read attached instruction sheets on bronchitis. Follow-up with your doctor within 5 days to recheck, sooner if worse or new symptoms. If you get severely worsening symptoms, go to emergency room.   Acute Bronchitis, Adult  Acute bronchitis is sudden (acute) swelling of the air tubes (bronchi) in the lungs. Acute bronchitis causes these tubes to fill with mucus, which can make it hard to breathe. It can also cause coughing or wheezing. In adults, acute bronchitis usually goes away within 2 weeks. A cough caused by bronchitis may last up to 3 weeks. Smoking, allergies, and asthma can make the condition worse. Repeated episodes of bronchitis may cause further lung problems, such as chronic obstructive pulmonary disease (COPD). What are the causes? This condition can be caused by germs and by substances that irritate the lungs, including:  Cold and flu viruses. This condition is most often caused by the same virus that causes a cold.  Bacteria.  Exposure to tobacco smoke, dust, fumes, and air pollution. What increases the risk? This condition is more likely to develop in people who:  Have close contact with someone with acute bronchitis.  Are exposed to  lung irritants, such as tobacco smoke, dust, fumes, and vapors.  Have a weak immune system.  Have a respiratory condition such as asthma. What are the signs or symptoms? Symptoms of this condition include:  A cough.  Coughing up clear, yellow, or green mucus.  Wheezing.  Chest congestion.  Shortness of breath.  A fever.  Body aches.  Chills.  A sore throat. How is this diagnosed? This condition is usually diagnosed with a physical exam. During the exam, your health care provider may order tests, such as chest X-rays, to rule out other conditions. He or she may also:  Test a sample of your mucus for bacterial infection.  Check the level of oxygen in your blood. This is done to check for pneumonia.  Do a chest X-ray or lung function testing to rule out pneumonia and other conditions.  Perform blood tests. Your health care provider will also ask about your symptoms and medical history. How is this treated? Most cases of acute bronchitis clear up over time without treatment. Your health care provider may recommend:  Drinking more fluids. Drinking more makes your mucus thinner, which may make it easier to breathe.  Taking a medicine for a fever or cough.  Taking an antibiotic medicine.  Using an inhaler to help improve shortness of breath and to control a cough.  Using a cool mist vaporizer or humidifier to make it easier to breathe. Follow these instructions at home: Medicines  Take over-the-counter and prescription medicines  only as told by your health care provider.  If you were prescribed an antibiotic, take it as told by your health care provider. Do not stop taking the antibiotic even if you start to feel better. General instructions   Get plenty of rest.  Drink enough fluids to keep your urine pale yellow.  Avoid smoking and secondhand smoke. Exposure to cigarette smoke or irritating chemicals will make bronchitis worse. If you smoke and you need help  quitting, ask your health care provider. Quitting smoking will help your lungs heal faster.  Use an inhaler, cool mist vaporizer, or humidifier as told by your health care provider.  Keep all follow-up visits as told by your health care provider. This is important. How is this prevented? To lower your risk of getting this condition again:  Wash your hands often with soap and water. If soap and water are not available, use hand sanitizer.  Avoid contact with people who have cold symptoms.  Try not to touch your hands to your mouth, nose, or eyes.  Make sure to get the flu shot every year. Contact a health care provider if:  Your symptoms do not improve in 2 weeks of treatment. Get help right away if:  You cough up blood.  You have chest pain.  You have severe shortness of breath.  You become dehydrated.  You faint or keep feeling like you are going to faint.  You keep vomiting.  You have a severe headache.  Your fever or chills gets worse. This information is not intended to replace advice given to you by your health care provider. Make sure you discuss any questions you have with your health care provider. Document Released: 11/14/2004 Document Revised: 05/21/2017 Document Reviewed: 03/27/2016 Elsevier Interactive Patient Education  2019 [employer].

## 2018-10-27 NOTE — Progress Notes (Signed)
[facility]   Patient ID: Thomas Durham DOB: 58 y.o. MRN: [id number]   Subjective: URI HISTORY  Thomas Durham is a 58 y.o. male who complains of onset of URI symptoms 6 days ago, progressed to chest congestion that is worsening over the past 3-4 days with cough productive of discolored sputum.   Have been using over-the-counter treatment which helps a little bit. He denies prior history of asthma or chronic lung disease. He states he is up-to-date with flu vaccine and pneumonia vaccines  No chills/sweats + Low-grade fever  + Minimal nasal congestion No post-nasal drainage No sinus pain/pressure No sore throat  +  cough He feels he has had mild wheezing Positive, chest congestion No hemoptysis No shortness of breath No pleuritic pain  No itchy/red eyes No earache  No nausea No vomiting No abdominal pain No diarrhea  No skin rashes +  Fatigue.  No seizures.  No syncope or focal neurologic symptoms. No myalgias No headache   Reviewed past medical history in his chart.  He is in a drug trial at [facility] for his history of left kidney cancer, he received an infusion at [facility] yesterday, but his URI symptoms started before the infusion.  Objective: Blood pressure 120/78, pulse (!) 107, temperature 98.7 F (37.1 C), temperature source Oral, resp. rate 14, SpO2 99 %.  Physical Exam Vitals signs and nursing note reviewed.  Constitutional:      General: He is not in acute distress.    Appearance: He is well-developed.  HENT:     Head: Normocephalic and atraumatic.     Right Ear: Tympanic membrane normal.     Left Ear: Tympanic membrane normal.     Nose: Nose normal.     Mouth/Throat:     Pharynx: No oropharyngeal exudate.  Eyes:     General: No scleral icterus.       Right eye: No discharge.        Left eye: No discharge.  Neck:     Musculoskeletal: Neck supple.  Cardiovascular:     Rate and Rhythm: Normal rate and  regular rhythm.     Heart sounds: Normal heart sounds.  Pulmonary:     Effort: No respiratory distress.     Breath sounds: Wheezing and rhonchi present. No rales.  Lymphadenopathy:     Cervical: No cervical adenopathy.  Skin:    General: Skin is warm and dry.  Neurological:     Mental Status: He is alert and oriented to person, place, and time.    DuoNeb nebulizer treatment given.  Patient reexamined, wheezing improved, but still had mild late expiratory wheezes, with moderate late expiratory wheezes on forced expiration.  Pulse ox rechecked 99% room air.  I rechecked pulse at 92, BP 118/78    Assessment: Acute bronchitis with bronchospasm   Plan: DuoNeb treatment as above   Treatment options discussed, as well as risks, benefits, alternatives. Patient voiced understanding and agreement with the following plans: (Both verbal instructions and printed in AVS):   You have bronchitis with wheezing. Here in the office, we gave you a DuoNeb bronchodilator breathing treatment, and your oxygen level improved from 97 to 99%, and your wheezing improved somewhat. Prescriptions sent to [facility]: Z-Pak, which is an antibiotic, and prednisone which is a corticosteroid to help with the wheezing New Prescriptions   AZITHROMYCIN (ZITHROMAX Z-PAK) 250 MG TABLET    Take 2 tablets on day one, then 1 tablet daily on  days 2 through 5   PREDNISONE (STERAPRED UNI-PAK 21 TAB) 10 MG (21) TBPK TABLET    Take as directed for 6 days.--Take 6 on day 1, 5 on day 2, 4 on day 3, then 3 tablets on day 4, then 2 tablets on day 5, then 1 on day 6.   Rest and drink plenty of fluids. Tylenol if needed for pain or fever. Please read attached instruction sheets on bronchitis. Follow-up with your doctor within 5 days to recheck, sooner if worse or new symptoms. If you get severely worsening symptoms, go to emergency room.  Precautions discussed. Red flags discussed. Questions invited and answered. Patient  voiced understanding and agreement.

## 2018-11-02 ENCOUNTER — Ambulatory Visit: Payer: Managed Care, Other (non HMO) | Admitting: Family Medicine

## 2018-11-02 ENCOUNTER — Encounter: Payer: Self-pay | Admitting: Family Medicine

## 2018-11-02 VITALS — BP 120/76 | HR 85 | Temp 98.4°F | Ht 67.0 in | Wt 256.6 lb

## 2018-11-02 DIAGNOSIS — Z85528 Personal history of other malignant neoplasm of kidney: Secondary | ICD-10-CM | POA: Diagnosis not present

## 2018-11-02 DIAGNOSIS — J209 Acute bronchitis, unspecified: Secondary | ICD-10-CM | POA: Diagnosis not present

## 2018-11-02 NOTE — Patient Instructions (Signed)
If you don't clear up over the next few days (or if you get worse), then let's get a chest xray Check out the information at familydoctor.org entitled "Nutrition for Weight Loss: What You Need to Know about Fad Diets" Try to lose between 1-2 pounds per week by taking in fewer calories and burning off more calories You can succeed by limiting portions, limiting foods dense in calories and fat, becoming more active, and drinking 8 glasses of water a day (64 ounces) Don't skip meals, especially breakfast, as skipping meals may alter your metabolism Do not use over-the-counter weight loss pills or gimmicks that claim rapid weight loss A healthy BMI (or body mass index) is between 18.5 and 24.9 You can calculate your ideal BMI at the [place] website [employer]Mon[employer]fr Start exercising slow and build up slowly

## 2018-11-02 NOTE — Progress Notes (Signed)
BP 120/76   Pulse 85   Temp 98.4 F (36.9 C)   Ht 5\' 7"  (1.702 m)   Wt 256 lb 9.6 oz (116.4 kg)   SpO2 96%   BMI 40.19 kg/m    Subjective:    Patient ID: Thomas Durham, male    DOB: 1961-03-30, 58 y.o.   MRN: [id number]  HPI: Thomas Durham is a 58 y.o. male  Chief Complaint  Patient presents with  . 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, just working in the jail; no TB exposure; false positives at work, but none proven to his knowledge No rash  Left renal cell carcinoma hx; seeing specialist  Morbid obesity; gained 12 pounds over the year; going to the gym and motivated to go the other way and start losing   Depression screen [facility] 2/9 11/02/2018 05/07/2018  Decreased Interest 0 0  Down, Depressed, Hopeless 0 0  PHQ - 2 Score 0 0  Altered sleeping 0 -  Tired, decreased energy 0 -  Change in appetite 0 -  Feeling bad or failure about yourself  0 -  Trouble concentrating 0 -  Moving slowly or fidgety/restless 0 -  Suicidal thoughts 0 -  PHQ-9 Score 0 -  Difficult doing work/chores Not difficult at all -   Fall Risk  11/02/2018 05/07/2018  Falls in the past year? 1 No  Number falls in past yr: 0 -  Injury with Fall? 0 -    Relevant past medical, surgical, family and social history reviewed Past Medical History:  Diagnosis Date  . Cancer ([place])    left kidney  . Obesity   . Ruptured globe    Past Surgical History:  Procedure Laterality Date  . EYE SURGERY  2015-16   x5  . removal of kidney Left    due to cancer  . TONSILLECTOMY  1977  . TOTAL HIP ARTHROPLASTY  2014   Family History  Problem Relation Age of Onset  . Cancer Mother   . Lung cancer Mother   . Bladder Cancer Mother   . Hypertension Father   . Stroke Father   . Cancer Brother   . Lung cancer  Brother   . Prostate cancer Neg Hx   . Kidney cancer Neg Hx    Social History   Tobacco Use  . Smoking status: Never Smoker  . Smokeless tobacco: Never Used  Substance Use Topics  . Alcohol use: Yes    Alcohol/week: 12.0 - 15.0 standard drinks    Types: 12 - 15 Cans of beer per week  . Drug use: No     Office Visit from 11/02/2018 in [facility]  AUDIT-C Score  1      Interim medical history since last visit reviewed. Allergies and medications reviewed  Review of Systems Per HPI unless specifically indicated above     Objective:    BP 120/76   Pulse 85   Temp 98.4 F (36.9 C)   Ht 5\' 7"  (1.702 m)   Wt 256 lb 9.6 oz (116.4 kg)   SpO2 96%   BMI 40.19 kg/m   Wt Readings from Last 3 Encounters:  11/02/18 256 lb 9.6 oz (116.4 kg)  05/07/18 244 lb (110.7 kg)  01/28/18 252  lb (114.3 kg)    Physical Exam Constitutional:      General: He is not in acute distress.    Appearance: He is well-developed. He is obese.     Comments: Weight gain noted  Eyes:     General: No scleral icterus. Cardiovascular:     Rate and Rhythm: Normal rate and regular rhythm.  Pulmonary:     Effort: Pulmonary effort is normal.     Breath sounds: Normal breath sounds.  Skin:    Coloration: Skin is not pale.  Neurological:     Mental Status: He is alert.     Results for orders placed or performed in visit on 06/09/18  HM COLONOSCOPY  Result Value Ref Range   HM Colonoscopy See Report (in chart) See Report (in chart), Patient Reported      Assessment & Plan:   Problem List Items Addressed This Visit      Other   Morbid obesity ([place])    Talked with patient, encouraged weight loss, see AVS; he sounds motivated to start losing; start activity low and build up slowly      Hx of renal cell carcinoma    History of, has seen specialist       Other Visit Diagnoses    Acute bronchitis, unspecified organism    -  Primary   treated with azithromycin by another  provider; also treated with prednisone; sounds to be resolving; call if not completely better in 1-2 weeks       Follow up plan: No follow-ups on file.  An after-visit summary was printed and given to the patient at [place].  Please see the patient instructions which may contain other information and recommendations beyond what is mentioned above in the assessment and plan.  No orders of the defined types were placed in this encounter.   No orders of the defined types were placed in this encounter.

## 2018-11-11 DIAGNOSIS — Z85528 Personal history of other malignant neoplasm of kidney: Secondary | ICD-10-CM | POA: Insufficient documentation

## 2018-11-11 NOTE — Assessment & Plan Note (Signed)
History of, has seen specialist

## 2018-11-11 NOTE — Assessment & Plan Note (Addendum)
Talked with patient, encouraged weight loss, see AVS; he sounds motivated to start losing; start activity low and build up slowly

## 2018-11-23 ENCOUNTER — Telehealth: Payer: Self-pay

## 2018-11-23 ENCOUNTER — Encounter: Payer: Self-pay | Admitting: Nurse Practitioner

## 2018-11-23 ENCOUNTER — Ambulatory Visit
Admission: RE | Admit: 2018-11-23 | Discharge: 2018-11-23 | Disposition: A | Discharge status: Home / Self Care | Payer: Managed Care, Other (non HMO) | Attending: Nurse Practitioner | Admitting: Nurse Practitioner

## 2018-11-23 ENCOUNTER — Ambulatory Visit
Admission: RE | Admit: 2018-11-23 | Discharge: 2018-11-23 | Disposition: A | Discharge status: Home / Self Care | Payer: Managed Care, Other (non HMO) | Source: Ambulatory Visit | Attending: Nurse Practitioner | Admitting: Nurse Practitioner

## 2018-11-23 ENCOUNTER — Ambulatory Visit: Payer: Managed Care, Other (non HMO) | Admitting: Nurse Practitioner

## 2018-11-23 VITALS — BP 130/68 | HR 100 | Temp 98.3°F | Resp 18 | Ht 67.0 in | Wt 258.3 lb

## 2018-11-23 DIAGNOSIS — R0602 Shortness of breath: Secondary | ICD-10-CM | POA: Insufficient documentation

## 2018-11-23 DIAGNOSIS — R062 Wheezing: Secondary | ICD-10-CM

## 2018-11-23 DIAGNOSIS — R059 Cough, unspecified: Secondary | ICD-10-CM

## 2018-11-23 DIAGNOSIS — J0101 Acute recurrent maxillary sinusitis: Secondary | ICD-10-CM

## 2018-11-23 DIAGNOSIS — R05 Cough: Secondary | ICD-10-CM | POA: Diagnosis present

## 2018-11-23 MED ORDER — ALBUTEROL SULFATE HFA 108 (90 BASE) MCG/ACT IN AERS: 2.0000 | INHALATION_SPRAY | Freq: Four times a day (QID) | RESPIRATORY_TRACT | 0 refills | Status: DC | PRN

## 2018-11-23 MED ORDER — DOXYCYCLINE HYCLATE 100 MG PO TABS: 100.0000 mg | ORAL_TABLET | Freq: Two times a day (BID) | ORAL | 0 refills | Status: DC

## 2018-11-23 NOTE — Progress Notes (Signed)
Name: Thomas Durham   MRN: [id number]    DOB: 1961-01-17   Date:11/23/2018       Progress Note  Subjective  Chief Complaint  Chief Complaint  Patient presents with  . Cough    productive (no color) - patient was recently treat with a Zpk & a steriod.  patient is on a experimental drug with [facility]. otc: Mucinex  . Wheezing  . Generalized Body Aches  . Headache    a lot of frontal head pressure    HPI  Patient endorses cough, wheezing, fever, chills. Was treated with z-pack and steroids about 2 weeks  ago with mild improvement but symptoms have returned and worsened. Endorses mild exertional shortness of breath, headache and facial fullness. Taking otc musinex.   Patient Active Problem List   Diagnosis Date Noted  . Morbid obesity ([place]) 11/11/2018  . Hx of renal cell carcinoma 11/11/2018  . High risk medication use 07/07/2018  . Examination of participant in clinical trial 01/19/2018    Past Medical History:  Diagnosis Date  . Cancer ([place])    left kidney  . Obesity   . Ruptured globe     Past Surgical History:  Procedure Laterality Date  . EYE SURGERY  2015-16   x5  . removal of kidney Left    due to cancer  . TONSILLECTOMY  1977  . TOTAL HIP ARTHROPLASTY  2014    Social History   Tobacco Use  . Smoking status: Never Smoker  . Smokeless tobacco: Never Used  . Tobacco comment: patient's wife vapes  Substance Use Topics  . Alcohol use: Yes    Alcohol/week: 12.0 - 15.0 standard drinks    Types: 12 - 15 Cans of beer per week     Current Outpatient Medications:  .  calcium carbonate (OS-CAL) 1250 (500 Ca) MG chewable tablet, Chew 2 tablets by mouth every other day. , Disp: , Rfl:  .  Difluprednate 0.05 % EMUL, Administer 1 drop to the right eye 6XD., Disp: , Rfl:  .  sildenafil (VIAGRA) 100 MG tablet, Take 0.5-1 tablets (50-100 mg total) by mouth daily as needed for erectile dysfunction., Disp: 10 tablet, Rfl: 3  Allergies  Allergen Reactions  . Amoxicillin  Rash  . Povidone Iodine Rash    Has used after without issue.     ROS   No other specific complaints in a complete review of systems (except as listed in HPI above).  Objective  Vitals:   11/23/18 0959  BP: 130/68  Pulse: 100  Resp: 18  Temp: 98.3 F (36.8 C)  TempSrc: Oral  SpO2: 94%  Weight: 258 lb 4.8 oz (117.2 kg)  Height: 5\' 7"  (1.702 m)    Body mass index is 40.46 kg/m.  Nursing Note and Vital Signs reviewed.  Physical Exam Constitutional:      General: He is not in acute distress.    Appearance: He is well-developed. He is not toxic-appearing.  HENT:     Head: Normocephalic and atraumatic.     Right Ear: Hearing, ear canal and external ear normal.     Left Ear: Hearing, ear canal and external ear normal.     Nose: Nasal tenderness, mucosal edema and congestion present.     Right Turbinates: Enlarged.     Left Turbinates: Enlarged.     Right Sinus: Maxillary sinus tenderness (worse on right) present. No frontal sinus tenderness.     Left Sinus: Maxillary sinus tenderness present. No frontal sinus  tenderness.     Mouth/Throat:     Mouth: Mucous membranes are moist.     Pharynx: Oropharynx is clear. Uvula midline.  Eyes:     Pupils: Pupils are equal, round, and reactive to light.  Cardiovascular:     Rate and Rhythm: Tachycardia present.  Pulmonary:     Breath sounds: Wheezing (bilaterall throughout, declined in office breathing treatment) present.  Lymphadenopathy:     Cervical: No cervical adenopathy.  Neurological:     Mental Status: He is alert and oriented to person, place, and time.  Psychiatric:        Mood and Affect: Mood normal.        Behavior: Behavior normal.       No results found for this or any previous visit (from the past 48 hour(s)).  Assessment & Plan  1. Acute recurrent maxillary sinusitis - doxycycline (VIBRA-TABS) 100 MG tablet; Take 1 tablet (100 mg total) by mouth 2 (two) times daily.  Dispense: 28 tablet; Refill:  0  2. Cough - DG Chest 2 View; Future  3. Wheezing - albuterol (PROVENTIL HFA;VENTOLIN HFA) 108 (90 Base) MCG/ACT inhaler; Inhale 2 puffs into the lungs every 6 (six) hours as needed for wheezing or shortness of breath.  Dispense: 1 Inhaler; Refill: 0 - DG Chest 2 View; Future  4. Shortness of breath - DG Chest 2 View; Future  Patient has severe tenderness of maxillary sinuses, fevers and chills, illness has been ongoing for greater than 2 weeks with some improvement with Z-Pak and steroids but never fully recovered.  Concern for pneumonia as well will treat with doxycycline to cover common bacteria for both sinus infections and pneumonia.  Discussed strict ER precautions.  Albuterol neb declined inhaler since to take as needed for wheezing.  Discussed continuing guaifenesin to thin out secretions, other OTC medications.  Rest, fluids, good nutrition.  -Red flags and when to present for emergency care or RTC including fever >101.42F, chest pain, shortness of breath, new/worsening/un-resolving symptoms, reviewed with patient at time of visit. Follow up and care instructions discussed and provided in AVS.

## 2018-11-23 NOTE — Telephone Encounter (Signed)
Copied from [place] [id number] [id number]. Topic: General - Other >> Nov 23, 2018  4:41 PM Ivar Drape wrote: Reason for CRM:   Patient is checking on the results of the xray he took today.

## 2018-11-23 NOTE — Patient Instructions (Signed)
Please go across the street to get your chest x-ray; I will send you a message today about your results and then let you know what to pick up.   Please continue musinex, drink plenty of water, rest, good nutrition. Take albuterol 1-2 puffs as needed for wheezing, shortness of breath of chest tightness.   Please take your antibiotic as prescribed with food on your stomach to prevent nausea and upset stomach. Take the antibiotic for the entire course, even if your symptoms resolve before the course if completed. Using antibiotics inappropriately can make it harder to treat future infections. If you have any concerning side effects please stop the medication and let us know immediately. I do recommend taking a probiotic to replenish your good gut health anytime you take an antibiotic. You can get probiotics in types of yogurt like activia, or other food/drinks such as kimchi, kombucha , sauerkraut, or you can take an over the counter supplement.   Please go to the ER with fever >101.30F- uncontrolled with acetaminophen and/or ibuprofen, chest pain, shortness of breath, new/worsening/un-resolving symptoms.  Cover your nose/mouth when you cough or sneeze and wash your hands well and often. Here are some helpful things you can use or pick up over the counter from the pharmacy to help with your symptoms:   For Fever/Pain: Acetaminophen every 6 hours as needed (maximum of 3000mg  a day). If you are still uncomfortable you can add ibuprofen OR naproxen  For coughing: try dextromethorphan for a cough suppressant, and/or a cool mist humidifier, lozenges  For sore throat: saline gargles, honey herbal tea, lozenges, throat spray  To dry out your nose: try an antihistamine like loratadine (non-sedating) or diphenhydramine (sedating) or others To relieve a stuffy nose: try flonase or a neti pot To make blowing your nose easier and relieve chest congestion: guaifenesin 400mg  every 4-6 hours of guaifenesin ER [phone]  mg every 12 hours. Do not take more than 2,400mg  a day.

## 2018-11-25 ENCOUNTER — Encounter: Payer: Self-pay | Admitting: Emergency Medicine

## 2018-11-25 ENCOUNTER — Ambulatory Visit: Payer: Self-pay

## 2018-11-25 ENCOUNTER — Encounter: Payer: Self-pay | Admitting: Nurse Practitioner

## 2018-11-25 ENCOUNTER — Ambulatory Visit: Payer: Managed Care, Other (non HMO) | Admitting: Nurse Practitioner

## 2018-11-25 VITALS — BP 124/76 | HR 96 | Temp 99.4°F | Resp 18 | Ht 67.0 in | Wt 255.2 lb

## 2018-11-25 DIAGNOSIS — L299 Pruritus, unspecified: Secondary | ICD-10-CM

## 2018-11-25 NOTE — Telephone Encounter (Signed)
Called and discussed results on 11/23/2018 with patient.

## 2018-11-25 NOTE — Progress Notes (Signed)
Name: Thomas Durham   MRN: [id number]    DOB: 1961/01/13   Date:11/25/2018       Progress Note  Subjective  Chief Complaint  Chief Complaint  Patient presents with  . Allergic Reaction  . Rash    on arms since taking medication    HPI  Patient started on doxycycline for acute bacterial sinusitis on 11/23/2018 notes itching and rash on arm started 2 hours ago. Has had 4 doses of antibiotic so far. Has had hives with amoxicillin in the past states these symptoms are mild in comparison. Denies shortness of breath, trouble swallowing, nausea, vomiting, abdominal pain, chest tightness.   Patient Active Problem List   Diagnosis Date Noted  . Morbid obesity ([place]) 11/11/2018  . Hx of renal cell carcinoma 11/11/2018  . High risk medication use 07/07/2018  . Examination of participant in clinical trial 01/19/2018    Past Medical History:  Diagnosis Date  . Cancer ([place])    left kidney  . Obesity   . Ruptured globe     Past Surgical History:  Procedure Laterality Date  . EYE SURGERY  2015-16   x5  . removal of kidney Left    due to cancer  . TONSILLECTOMY  1977  . TOTAL HIP ARTHROPLASTY  2014    Social History   Tobacco Use  . Smoking status: Never Smoker  . Smokeless tobacco: Never Used  . Tobacco comment: patient's wife vapes  Substance Use Topics  . Alcohol use: Yes    Alcohol/week: 12.0 - 15.0 standard drinks    Types: 12 - 15 Cans of beer per week     Current Outpatient Medications:  .  albuterol (PROVENTIL HFA;VENTOLIN HFA) 108 (90 Base) MCG/ACT inhaler, Inhale 2 puffs into the lungs every 6 (six) hours as needed for wheezing or shortness of breath., Disp: 1 Inhaler, Rfl: 0 .  calcium carbonate (OS-CAL) 1250 (500 Ca) MG chewable tablet, Chew 2 tablets by mouth every other day. , Disp: , Rfl:  .  Difluprednate 0.05 % EMUL, Administer 1 drop to the right eye 6XD., Disp: , Rfl:  .  doxycycline (VIBRA-TABS) 100 MG tablet, Take 1 tablet (100 mg total) by mouth 2 (two)  times daily., Disp: 28 tablet, Rfl: 0 .  sildenafil (VIAGRA) 100 MG tablet, Take 0.5-1 tablets (50-100 mg total) by mouth daily as needed for erectile dysfunction., Disp: 10 tablet, Rfl: 3  Allergies  Allergen Reactions  . Amoxicillin Rash  . Povidone Iodine Rash    Has used after without issue.     ROS   No other specific complaints in a complete review of systems (except as listed in HPI above).  Objective  Vitals:   11/25/18 1354  BP: 124/76  Pulse: 96  Resp: 18  Temp: 99.4 F (37.4 C)  TempSrc: Oral  SpO2: 98%  Weight: 255 lb 3.2 oz (115.8 kg)  Height: 5\' 7"  (1.702 m)     Body mass index is 39.97 kg/m.  Nursing Note and Vital Signs reviewed.  Physical Exam Constitutional:      Appearance: Normal appearance.  HENT:     Head: Normocephalic and atraumatic.  Eyes:     Conjunctiva/sclera: Conjunctivae normal.  Cardiovascular:     Rate and Rhythm: Tachycardia present.  Pulmonary:     Effort: Pulmonary effort is normal.     Breath sounds: Normal breath sounds.  Skin:    General: Skin is warm and dry.     Findings: Erythema present.  Neurological:     General: No focal deficit present.     Mental Status: He is alert and oriented to person, place, and time.  Psychiatric:        Mood and Affect: Mood normal.        Thought Content: Thought content normal.      No results found for this or any previous visit (from the past 48 hour(s)).  Assessment & Plan  1. Pruritic condition Possible reaction to the doxycycline. He has noted improvement on this medication, however has been having some mild itching on bilateral arms. Has not been doing yard work or anything that may have caused this.  Patient allergic to amoxicillin, has GI upset with clindamycin that he takes prior to dental appointments.  Has had sinus infection for over 2 weeks with significant sinus tenderness and noted improvement on antibiotic.  Recommend taking Benadryl at night, consider  Pepcid and using hydrocortisone cream for itching and continue doxycycline as this reaction is mild.  If patient itching worsens or rash worsens stop medication and call us.  If patient develops shortness of breath, trouble swallowing, chest tightness, nausea vomiting, rash all over body please go to ER.

## 2018-11-25 NOTE — Telephone Encounter (Signed)
Returned call to patient who states that he developed a rash small spots to his left arm today. He says they start at his wrist and go to just before his arm pit. He describes mild itching. He was started on doxycycline on Monday. He states that he is on an double blind trial of chem medication which is an injection. His last injection was the 29th and in his left arm. Pt denies other symptoms. Appointment scheduled per protocol.  Care advice read to patient. Pt verbalized understanding of all instructions.  Reason for Disposition . Taking new prescription antibiotic (Exception: finished taking new prescription antibiotic)  Answer Assessment - Initial Assessment Questions 1. APPEARANCE of RASH: "Describe the rash." (e.g., spots, blisters, raised areas, skin peeling, scaly)     spots 2. SIZE: "How big are the spots?" (e.g., tip of pen, eraser, coin; inches, centimeters)     Tip of pen spots like poison oak 3. LOCATION: "Where is the rash located?"     Left wrist to arm pit 4. COLOR: "What color is the rash?" (Note: It is difficult to assess rash color in people with darker-colored skin. When this situation occurs, simply ask the caller to describe what they see.)     red 5. ONSET: "When did the rash begin?"     today 6. FEVER: "Do you have a fever?" If so, ask: "What is your temperature, how was it measured, and when did it start?"     no 7. ITCHING: "Does the rash itch?" If so, ask: "How bad is the itch?" (Scale 1-10; or mild, moderate, severe)     2 8. CAUSE: "What do you think is causing the rash?"     Doxycyciline 9. NEW MEDICATION: "What new medication are you taking?" (e.g., name of antibiotic) "When did you start taking this medication?".     Doxycyline 3rd dose 10. OTHER SYMPTOMS: "Do you have any other symptoms?" (e.g., sore throat, fever, joint pain)       no 11. PREGNANCY: "Is there any chance you are pregnant?" "When was your last menstrual period?"       N/A  Protocols used:  RASH - WIDESPREAD ON DRUGS-A-AH

## 2018-11-25 NOTE — Patient Instructions (Signed)
-   take 25-50 mg of benadryl at night - OTC hydrocortisone cream on itching areas, cool compress - can additionally take 20mg  of pepcid at night time - If you develop worsening rash, hives please stop medicine and cal lus - If you develop shortness of breath, rash throughout entire body, problems swallowing, vomiting, chest tightness and heart racing please go to ER

## 2018-12-01 ENCOUNTER — Emergency Department
Admission: EM | Admit: 2018-12-01 | Discharge: 2018-12-01 | Disposition: A | Discharge status: Home / Self Care | Payer: Managed Care, Other (non HMO) | Attending: Emergency Medicine | Admitting: Emergency Medicine

## 2018-12-01 ENCOUNTER — Emergency Department: Payer: Managed Care, Other (non HMO)

## 2018-12-01 ENCOUNTER — Other Ambulatory Visit: Payer: Self-pay

## 2018-12-01 DIAGNOSIS — R0981 Nasal congestion: Secondary | ICD-10-CM

## 2018-12-01 DIAGNOSIS — J3489 Other specified disorders of nose and nasal sinuses: Secondary | ICD-10-CM | POA: Diagnosis present

## 2018-12-01 DIAGNOSIS — J069 Acute upper respiratory infection, unspecified: Secondary | ICD-10-CM | POA: Diagnosis not present

## 2018-12-01 DIAGNOSIS — Z79899 Other long term (current) drug therapy: Secondary | ICD-10-CM | POA: Diagnosis not present

## 2018-12-01 DIAGNOSIS — C649 Malignant neoplasm of unspecified kidney, except renal pelvis: Secondary | ICD-10-CM | POA: Diagnosis not present

## 2018-12-01 DIAGNOSIS — B9789 Other viral agents as the cause of diseases classified elsewhere: Secondary | ICD-10-CM

## 2018-12-01 MED ORDER — HYDROCODONE-HOMATROPINE 5-1.5 MG/5ML PO SYRP: 5.0000 mL | ORAL_SOLUTION | Freq: Four times a day (QID) | ORAL | 0 refills | Status: DC | PRN

## 2018-12-01 NOTE — Discharge Instructions (Addendum)
You were diagnosed with an influenza-like viral illness.  You will feel ill for as much as a few weeks.  Please take any prescribed medications as instructed, and you may use over-the-counter Tylenol and/or ibuprofen as needed according to label instructions (unless you have an allergy to either or have been told by your doctor not to take them).  Please make sure to drink plenty of fluids and refer to the included information about rehydration.  Finish your course of doxycycline as prescribed.  Use your albuterol inhaler as needed (2-4 puff at a time is fine).   Follow up with your physician as instructed above, and return to the Emergency Department (ED) if you are unable to tolerate fluids due to vomiting, have worsening trouble breathing, become extremely tired or difficult to awaken, or if you develop any other symptoms that concern you.

## 2018-12-01 NOTE — ED Provider Notes (Signed)
[facility] Emergency Department Provider Note  ____________________________________________   First MD Initiated Contact with Patient 12/01/18 [phone]     (approximate)  I have reviewed the triage vital signs and the nursing notes.   HISTORY  Chief Complaint Nasal Congestion    HPI Thomas Durham is a 58 y.o. male whose history notably includes renal cell carcinoma status post left nephrectomy.  He is currently undergoing a clinical trial of immunotherapy at [facility] so he has been told that he is immunocompromised.  He presents tonight for evaluation for persistent runny nose, congestion, occasional subjective fever, and persistent cough.  He says that he thought he would have felt better by now because he has been taking an empiric prescription for doxycycline for about a week.  He states that his sinuses do feel better but that he is still having the cough although it is no worse.  He occasionally feels short of breath with exertion.  He became diaphoretic while he was at work and someone he worked with insisted that he come to the emergency department for evaluation.  He states he feels fine now but he is frustrated his symptoms have not resolved.  He states he is in very close contact with the team at [facility] and has told him about his respiratory symptoms but they have not changed any plans yet.  He goes back in a few days for lab draw and additional treatments.  He denies nausea, vomiting, chest pain, abdominal pain, and dysuria.  Past Medical History:  Diagnosis Date  . Cancer ([place])    left kidney  . Obesity   . Ruptured globe     Patient Active Problem List   Diagnosis Date Noted  . Morbid obesity ([place]) 11/11/2018  . Hx of renal cell carcinoma 11/11/2018  . High risk medication use 07/07/2018  . Examination of participant in clinical trial 01/19/2018    Past Surgical History:  Procedure Laterality Date  . EYE SURGERY  2015-16   x5  . removal of  kidney Left    due to cancer  . TONSILLECTOMY  1977  . TOTAL HIP ARTHROPLASTY  2014    Prior to Admission medications   Medication Sig Start Date End Date Taking? Authorizing Provider  albuterol (PROVENTIL HFA;VENTOLIN HFA) 108 (90 Base) MCG/ACT inhaler Inhale 2 puffs into the lungs every 6 (six) hours as needed for wheezing or shortness of breath. 11/23/18   Poulose, Bethel Born, NP  calcium carbonate (OS-CAL) 1250 (500 Ca) MG chewable tablet Chew 2 tablets by mouth every other day.     [provider]  Difluprednate 0.05 % EMUL Administer 1 drop to the right eye 6XD. 12/12/14   [provider]  doxycycline (VIBRA-TABS) 100 MG tablet Take 1 tablet (100 mg total) by mouth 2 (two) times daily. 11/23/18   Poulose, Bethel Born, NP  HYDROcodone-homatropine (HYCODAN) 5-1.5 MG/5ML syrup Take 5 mLs by mouth every 6 (six) hours as needed for cough. 12/01/18   Hinda Kehr, MD  sildenafil (VIAGRA) 100 MG tablet Take 0.5-1 tablets (50-100 mg total) by mouth daily as needed for erectile dysfunction. 05/14/18   Arnetha Courser, MD    Allergies Amoxicillin and Povidone iodine  Family History  Problem Relation Age of Onset  . Cancer Mother   . Lung cancer Mother   . Bladder Cancer Mother   . Hypertension Father   . Stroke Father   . Cancer Brother   . Lung cancer Brother   .  Prostate cancer Neg Hx   . Kidney cancer Neg Hx     Social History Social History   Tobacco Use  . Smoking status: Never Smoker  . Smokeless tobacco: Never Used  . Tobacco comment: patient's wife vapes  Substance Use Topics  . Alcohol use: Yes    Alcohol/week: 12.0 - 15.0 standard drinks    Types: 12 - 15 Cans of beer per week  . Drug use: No    Review of Systems Constitutional: Subjective fever/chills Eyes: No visual changes. ENT: Nasal congestion and runny nose with some sinus pressure, improving since last week Cardiovascular: Denies chest pain. Respiratory: Occasional shortness of breath with  exertion.  Persistent cough. Gastrointestinal: No abdominal pain.  No nausea, no vomiting.  No diarrhea.  No constipation. Genitourinary: Negative for dysuria. Musculoskeletal: Negative for neck pain.  Negative for back pain. Integumentary: Negative for rash. Neurological: Negative for headaches, focal weakness or numbness.   ____________________________________________   PHYSICAL EXAM:  VITAL SIGNS: ED Triage Vitals  Enc Vitals Group     BP 12/01/18 0251 (!) 162/92     Pulse Rate 12/01/18 0251 88     Resp 12/01/18 0251 20     Temp 12/01/18 0251 97.6 F (36.4 C)     Temp Source 12/01/18 0251 Oral     SpO2 12/01/18 0251 97 %     Weight 12/01/18 0252 115.7 kg (255 lb)     Height 12/01/18 0252 1.727 m (5\' 8" )     Head Circumference --      Peak Flow --      Pain Score 12/01/18 0251 0     Pain Loc --      Pain Edu? --      Excl. in [place]? --     Constitutional: Alert and oriented. Well appearing and in no acute distress. Eyes: Conjunctivae are normal.  Head: Atraumatic. Nose: +congestion/rhinnorhea. Neck: No stridor.  No meningeal signs.   Cardiovascular: Normal rate, regular rhythm. Good peripheral circulation. Grossly normal heart sounds. Respiratory: Normal respiratory effort.  No retractions. Lungs CTAB.  Occasional mild nonproductive cough. Gastrointestinal: Soft and nontender. No distention.  Musculoskeletal: No lower extremity tenderness nor edema. No gross deformities of extremities. Neurologic:  Normal speech and language. No gross focal neurologic deficits are appreciated.  Skin:  Skin is warm, dry and intact. No rash noted. Psychiatric: Mood and affect are normal. Speech and behavior are normal.  ____________________________________________   LABS (all labs ordered are listed, but only abnormal results are displayed)  Labs Reviewed - No data to display ____________________________________________  EKG  None - EKG not ordered by ED  physician ____________________________________________  RADIOLOGY   ED MD interpretation: No indication of acute abnormality on chest x-ray  Official radiology report(s): Dg Chest 2 View  Result Date: 12/01/2018 CLINICAL DATA:  Cough and congestion EXAM: CHEST - 2 VIEW COMPARISON:  11/23/2018 FINDINGS: The heart size and mediastinal contours are within normal limits. Both lungs are clear. The visualized skeletal structures are unremarkable. IMPRESSION: No active cardiopulmonary disease. Electronically Signed   By: Inez Catalina M.D.   On: 12/01/2018 03:20    ____________________________________________   PROCEDURES  Critical Care performed: No   Procedure(s) performed:   Procedures   ____________________________________________   INITIAL IMPRESSION / ASSESSMENT AND PLAN / ED COURSE  As part of my medical decision making, I reviewed the following data within the [place] notes reviewed and incorporated, Old chart reviewed, Radiograph reviewed  and  Notes from prior ED visits    Differential diagnosis includes, but is not limited to, viral illness, influenza or influenza-like illness, pneumonia, less likely bacteremia or sepsis.  The patient is actually fairly well-appearing in spite of his persistent respiratory symptoms.  He is laughing and joking with me and is not in distress.  He has had symptoms for about a week and I suspect that they were viral to begin with but he was placed on doxycycline to be safe given his immunosuppression.  His symptoms have not changed significantly except that his sinus symptoms have improved.  The patient's vital signs are all stable and within normal limits in the emergency department.  His chest x-ray does not show any sign of developing pneumonia.  I had my usual customary influenza/viral illness management discussion and explained why I would not recommend lab work at this time.  He understands and agrees and says he  has close follow-up with [facility].  I strongly encouraged him to follow-up as soon as possible with his clinical care team at [facility] regarding how his symptoms may affect his clinical trial.  He understands and agrees and I gave my usual customary return precautions.     ____________________________________________  FINAL CLINICAL IMPRESSION(S) / ED DIAGNOSES  Final diagnoses:  Viral URI with cough  Nasal congestion     MEDICATIONS GIVEN DURING THIS VISIT:  Medications - No data to display   ED Discharge Orders         Ordered    HYDROcodone-homatropine (HYCODAN) 5-1.5 MG/5ML syrup  Every 6 hours PRN     12/01/18 0630           Note:  This document was prepared using Dragon voice recognition software and may include unintentional dictation errors.   Hinda Kehr, MD 12/01/18 [phone]

## 2018-12-01 NOTE — ED Triage Notes (Signed)
Pt in with co runny nose and congestion, had cxr a week ago was told it was not pneumonia. Pt here for persistent cough, no fever recently. Pt is currently on doxycycline but unsure of dx.

## 2019-04-13 ENCOUNTER — Other Ambulatory Visit: Payer: Self-pay

## 2019-04-13 ENCOUNTER — Encounter: Payer: Self-pay | Admitting: Adult Health

## 2019-04-13 ENCOUNTER — Telehealth: Payer: Self-pay | Admitting: Nurse Practitioner

## 2019-04-13 ENCOUNTER — Ambulatory Visit: Payer: Managed Care, Other (non HMO) | Admitting: Adult Health

## 2019-04-13 VITALS — BP 142/82 | HR 72 | Temp 98.6°F | Resp 18 | Ht 68.0 in | Wt 277.0 lb

## 2019-04-13 DIAGNOSIS — Z008 Encounter for other general examination: Secondary | ICD-10-CM

## 2019-04-13 DIAGNOSIS — Z0189 Encounter for other specified special examinations: Secondary | ICD-10-CM

## 2019-04-13 NOTE — Patient Instructions (Signed)
Follow up with primary care as needed for chronic and maintenance health care- can be seen in this employee clinic for acute care.   I will have the office call you on your glucose and cholesterol results when they return if you have not heard within 1 week please call the office.  This biometric physical is a brief physical and the only labs done are glucose and your lipid panel(cholesterol) and is  not a substitute for seeing a primary care provider for a complete annual physical. Please see a primary care physician for routine health maintenance, labs and full physical at least yearly and follow up as recommended by your provider. Provider also recommends if you do not have a primary care provider for patient to establish care as soon as possible .Patient may chose provider of choice. Also gave the [place] at [phone] or web site at [email or web address] to help assist with finding a primary care doctor.  Patient verbalizes understanding that his office is acute care only and not a substitute for a primary care or for the management of chronic conditions.    Health Maintenance, Male A healthy lifestyle and preventive care is important for your health and wellness. Ask your health care provider about what schedule of regular examinations is right for you. What should I know about weight and diet? Eat a Healthy Diet  Eat plenty of vegetables, fruits, whole grains, low-fat dairy products, and lean protein.  Do not eat a lot of foods high in solid fats, added sugars, or salt.  Maintain a Healthy Weight Regular exercise can help you achieve or maintain a healthy weight. You should:  Do at least 150 minutes of exercise each week. The exercise should increase your heart rate and make you sweat (moderate-intensity exercise).  Do strength-training exercises at least twice a week. Watch Your Levels of Cholesterol and Blood Lipids  Have your blood tested  for lipids and cholesterol every 5 years starting at 59 years of age. If you are at high risk for heart disease, you should start having your blood tested when you are 59 years old. You may need to have your cholesterol levels checked more often if: ? Your lipid or cholesterol levels are high. ? You are older than 59 years of age. ? You are at high risk for heart disease. What should I know about cancer screening? Many types of cancers can be detected early and may often be prevented. Lung Cancer  You should be screened every year for lung cancer if: ? You are a current smoker who has smoked for at least 30 years. ? You are a former smoker who has quit within the past 15 years.  Talk to your health care provider about your screening options, when you should start screening, and how often you should be screened. Colorectal Cancer  Routine colorectal cancer screening usually begins at 59 years of age and should be repeated every 5-10 years until you are 59 years old. You may need to be screened more often if early forms of precancerous polyps or small growths are found. Your health care provider may recommend screening at an earlier age if you have risk factors for colon cancer.  Your health care provider may recommend using home test kits to check for hidden blood in the stool.  A small camera at the end of a tube can be used to examine your colon (sigmoidoscopy or colonoscopy). This checks for  the earliest forms of colorectal cancer. Prostate and Testicular Cancer  Depending on your age and overall health, your health care provider may do certain tests to screen for prostate and testicular cancer.  Talk to your health care provider about any symptoms or concerns you have about testicular or prostate cancer. Skin Cancer  Check your skin from head to toe regularly.  Tell your health care provider about any new moles or changes in moles, especially if: ? There is a change in a mole's size,  shape, or color. ? You have a mole that is larger than a pencil eraser.  Always use sunscreen. Apply sunscreen liberally and repeat throughout the day.  Protect yourself by wearing long sleeves, pants, a wide-brimmed hat, and sunglasses when outside. What should I know about heart disease, diabetes, and high blood pressure?  If you are 17-57 years of age, have your blood pressure checked every 3-5 years. If you are 25 years of age or older, have your blood pressure checked every year. You should have your blood pressure measured twice-once when you are at a hospital or clinic, and once when you are not at a hospital or clinic. Record the average of the two measurements. To check your blood pressure when you are not at a hospital or clinic, you can use: ? An automated blood pressure machine at a pharmacy. ? A home blood pressure monitor.  Talk to your health care provider about your target blood pressure.  If you are between 55-35 years old, ask your health care provider if you should take aspirin to prevent heart disease.  Have regular diabetes screenings by checking your fasting blood sugar level. ? If you are at a normal weight and have a low risk for diabetes, have this test once every three years after the age of 90. ? If you are overweight and have a high risk for diabetes, consider being tested at a younger age or more often.  A one-time screening for abdominal aortic aneurysm (AAA) by ultrasound is recommended for men aged 67-75 years who are current or former smokers. What should I know about preventing infection? Hepatitis B If you have a higher risk for hepatitis B, you should be screened for this virus. Talk with your health care provider to find out if you are at risk for hepatitis B infection. Hepatitis C Blood testing is recommended for:  Everyone born from [birth year] through 1966.  Anyone with known risk factors for hepatitis C. Sexually Transmitted Diseases (STDs)  You  should be screened each year for STDs including gonorrhea and chlamydia if: ? You are sexually active and are younger than 60 years of age. ? You are older than 59 years of age and your health care provider tells you that you are at risk for this type of infection. ? Your sexual activity has changed since you were last screened and you are at an increased risk for chlamydia or gonorrhea. Ask your health care provider if you are at risk.  Talk with your health care provider about whether you are at high risk of being infected with HIV. Your health care provider may recommend a prescription medicine to help prevent HIV infection. What else can I do?  Schedule regular health, dental, and eye exams.  Stay current with your vaccines (immunizations).  Do not use any tobacco products, such as cigarettes, chewing tobacco, and e-cigarettes. If you need help quitting, ask your health care provider.  Limit alcohol intake to no more  than 2 drinks per day. One drink equals 12 ounces of beer, 5 ounces of wine, or 1 ounces of hard liquor.  Do not use street drugs.  Do not share needles.  Ask your health care provider for help if you need support or information about quitting drugs.  Tell your health care provider if you often feel depressed.  Tell your health care provider if you have ever been abused or do not feel safe at home. This information is not intended to replace advice given to you by your health care provider. Make sure you discuss any questions you have with your health care provider. Document Released: 04/04/2008 Document Revised: 06/05/2016 Document Reviewed: 07/11/2015 Elsevier Interactive Patient Education  2019 [employer].

## 2019-04-13 NOTE — Telephone Encounter (Signed)
Please schedule 3 month physical

## 2019-04-13 NOTE — Progress Notes (Signed)
[facility]  Thomas Durham DOB: 58 y.o. MRN: [id number]  Subjective:  Here for Biometric Screen/brief exam Patient is here for a biometric physical for the [facility].  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  He was losing weight and had lost about 40 pounds and feels like he is regaining as his gym has been closed for COVID-19 pandemic and he has been less active. He does report that he tries to eat well, but his diet could do some improvement, His blood pressure is mildly elevated at this visit, patient is aware and will monitor his blood pressure and report any elevated readings to his primary care provider. Patient has follow-up with nephrology and his primary care Dr. 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  He has been entered clinical trials for the last year with a history of renal cell carcinoma.  He denies any new or recurrent issues.  He denies any hematuria or difficulty urinating.  He denies any abdominal pain.  Patient  denies any fever, body aches,chills, rash, chest pain, shortness of breath, nausea, vomiting, or diarrhea.     Objective: Blood pressure (!) 142/82, pulse 72, temperature 98.6 F (37 C), temperature source Oral, resp. rate 18, height 5\' 8"  (1.727 m), weight 277 lb (125.6 kg), SpO2 96 %. NAD HEENT: Within normal limits Neck: Normal, supple  Heart: Regular rate and rhythm Lungs: Clear  Assessment: Biometric screen   Plan: Patient is advised to follow-up with his primary care for a yearly physical and as directed by primary care with follow-ups.  He is advised to also keep his appointments with his specialist regarding his history of renal Cell carcinoma and removal of left kidney due to malignant neoplasm. Fasting glucose and lipids. Discussed with patient that today's visit here is a limited biometric screening visit (not a comprehensive exam or  management of any chronic problems) Discussed some health issues, including healthy eating habits and exercise. Encouraged to follow-up with PCP for annual comprehensive preventive and wellness care (and if applicable, any chronic issues). Questions invited and answered.  He would like to have a copy of his biometric once this is complete.  I will have the office call you on your glucose and cholesterol results when they return if you have not heard within 1 week please call the office.  This biometric physical is a brief physical and the only labs done are glucose and your lipid panel(cholesterol) and is  not a substitute for seeing a primary care provider for a complete annual physical. Please see a primary care physician for routine health maintenance, labs and full physical at least yearly and follow up as recommended by your provider. Provider also recommends if you do not have a primary care provider for patient to establish care as soon as possible .Patient may chose provider of choice. Also gave the [facility] at [phone] or web site at [email or web address] to help assist with finding a primary care doctor.  Patient verbalizes understanding that his office is acute care only and not a substitute for a primary care or for the management of chronic conditions.     Follow up with primary care as needed for chronic and maintenance health care- can be seen in this employee clinic for acute care.

## 2019-04-14 ENCOUNTER — Encounter: Payer: Self-pay | Admitting: Adult Health

## 2019-04-14 LAB — LIPID PANEL WITH LDL/HDL RATIO
Cholesterol, Total: 136 mg/dL (ref 100–199)
HDL: 50 mg/dL (ref >39)
LDL Calculated: 67 mg/dL (ref 0–99)
LDl/HDL Ratio: 1.3 ratio (ref 0.0–3.6)
Triglycerides: 93 mg/dL (ref 0–149)
VLDL Cholesterol Cal: 19 mg/dL (ref 5–40)

## 2019-04-14 LAB — GLUCOSE, RANDOM: Glucose: 109 mg/dL — ABNORMAL HIGH (ref 65–99)

## 2019-04-14 NOTE — Telephone Encounter (Signed)
lvm for pt to call the office to schedule an appt  °

## 2019-04-20 ENCOUNTER — Encounter: Payer: Managed Care, Other (non HMO) | Admitting: Adult Health

## 2019-07-13 ENCOUNTER — Telehealth: Payer: Self-pay

## 2019-07-13 DIAGNOSIS — Z1211 Encounter for screening for malignant neoplasm of colon: Secondary | ICD-10-CM

## 2019-07-13 NOTE — Telephone Encounter (Signed)
Copied from [place] [id number]. Topic: Referral - Request for Referral >> Jul 13, 2019  9:22 AM Celene Kras A wrote: Has patient seen PCP for this complaint? No. *If NO, is insurance requiring patient see PCP for this issue before PCP can refer them? Referral for which specialty: GI  Preferred provider/office: GI office  Reason for referral: Colonoscopy

## 2019-07-16 ENCOUNTER — Encounter: Payer: Self-pay | Admitting: Adult Health

## 2019-07-17 ENCOUNTER — Encounter: Payer: Self-pay | Admitting: Family Medicine

## 2019-08-20 ENCOUNTER — Ambulatory Visit: Payer: Managed Care, Other (non HMO) | Admitting: Medical

## 2019-08-20 ENCOUNTER — Encounter: Payer: Self-pay | Admitting: Medical

## 2019-08-20 ENCOUNTER — Other Ambulatory Visit: Payer: Self-pay

## 2019-08-20 ENCOUNTER — Encounter (INDEPENDENT_AMBULATORY_CARE_PROVIDER_SITE_OTHER): Payer: Self-pay

## 2019-08-20 DIAGNOSIS — R05 Cough: Secondary | ICD-10-CM | POA: Diagnosis not present

## 2019-08-20 DIAGNOSIS — Z20822 Contact with and (suspected) exposure to covid-19: Secondary | ICD-10-CM

## 2019-08-20 DIAGNOSIS — R062 Wheezing: Secondary | ICD-10-CM | POA: Diagnosis not present

## 2019-08-20 DIAGNOSIS — Z20828 Contact with and (suspected) exposure to other viral communicable diseases: Secondary | ICD-10-CM | POA: Diagnosis not present

## 2019-08-20 DIAGNOSIS — R519 Headache, unspecified: Secondary | ICD-10-CM

## 2019-08-20 DIAGNOSIS — R059 Cough, unspecified: Secondary | ICD-10-CM

## 2019-08-20 DIAGNOSIS — M791 Myalgia, unspecified site: Secondary | ICD-10-CM

## 2019-08-20 NOTE — Progress Notes (Signed)
Permission to treat by telemedicine.  58 yo male in non acute distress. Called today due to symptoms starting on Monday 08/17/2019 with sore throat , fever 99.9, myalgias, fatigue, cough and wheezing.   He started with symptoms on Tuesday took Wednesday off and then return to work 1/2 days on Thursday. He works for the [employer].  He had been tested weekly but now he has not been tested in  4 weeks.  He took Tylenol tablets x 2 of the  325 mg strength.  He would like  2 hour Covid testing.  He has a history of Bronchitis and Pneumonia x 2. He does have the pneumonia vaccine and he also has received the Flu vaccine this season. There were no vitals taken for this visit. Telemedicine appointment. Allergies  Allergen Reactions  . Amoxicillin Rash  . Povidone Iodine Rash    Has used after without issue.     Current Outpatient Medications:  .  albuterol (PROVENTIL HFA;VENTOLIN HFA) 108 (90 Base) MCG/ACT inhaler, Inhale 2 puffs into the lungs every 6 (six) hours as needed for wheezing or shortness of breath., Disp: 1 Inhaler, Rfl: 0 .  calcium carbonate (OS-CAL) 1250 (500 Ca) MG chewable tablet, Chew 2 tablets by mouth every other day. , Disp: , Rfl:  .  Difluprednate 0.05 % EMUL, Administer 1 drop to the right eye 6XD., Disp: , Rfl:  .  HYDROcodone-homatropine (HYCODAN) 5-1.5 MG/5ML syrup, Take 5 mLs by mouth every 6 (six) hours as needed for cough., Disp: 120 mL, Rfl: 0 .  sildenafil (VIAGRA) 100 MG tablet, Take 0.5-1 tablets (50-100 mg total) by mouth daily as needed for erectile dysfunction., Disp: 10 tablet, Rfl: 3  PMHx/Sx. Renal Cancer with removal of the Left kidney. Hip replacement on the Right side. 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  He is to stay in quarantine till Covid-19 results return. He must also be fever free with out fever reducer, and all other symptoms resolved x 10 days.   Social History   Socioeconomic History  . Marital status: Married   Spouse name: Abigail Butts   . Number of children: 2  . Years of education: Not on file  . Highest education level: Some college, no degree  Occupational History  . Not on file  Social Needs  . Financial resource strain: Not hard at all  . Food insecurity    Worry: Never true    Inability: Never true  . Transportation needs    Medical: No    Non-medical: No  Tobacco Use  . Smoking status: Never Smoker  . Smokeless tobacco: Never Used  . Tobacco comment: patient's wife vapes  Substance and Sexual Activity  . Alcohol use: Yes    Alcohol/week: 12.0 - 15.0 standard drinks    Types: 12 - 15 Cans of beer per week  . Drug use: No  . Sexual activity: Yes    Partners: Female  Lifestyle  . Physical activity    Days per week: 7 days    Minutes per session: 120 min  . Stress: To some extent  Relationships  . Social connections    Talks on phone: More than three times a week    Gets together: Three times a week    Attends religious service: 1 to 4 times per year    Active member of club or organization: No    Attends meetings of clubs or organizations: Never    Relationship status: Married  . Intimate partner  violence    Fear of current or ex partner: No    Emotionally abused: No    Physically abused: No    Forced sexual activity: No  Other Topics Concern  . Not on file  Social History Narrative  . Not on file   Family History  Problem Relation Age of Onset  . Cancer Mother   . Lung cancer Mother   . Bladder Cancer Mother   . Hypertension Father   . Stroke Father   . Cancer Brother   . Lung cancer Brother   . Prostate cancer Neg Hx   . Kidney cancer Neg Hx     Review of Systems  Constitutional: Positive for fever and malaise/fatigue.  HENT: Positive for sore throat. Negative for ear pain.   Eyes: Negative.   Respiratory: Positive for cough and wheezing. Negative for shortness of breath.        Wheezing  Gastrointestinal: Negative.   Musculoskeletal: Positive for  myalgias.  Skin: Negative.   Neurological: Positive for headaches.  Endo/Heme/Allergies: Negative for environmental allergies.     PE: not preformed due telemedicine appointment.  He is able to speak in full sentences. And he coughed one time, during out phone call.   DX/Plan Pharyngitis , myalgias, Headache and fever. COVID-19 testing ordered sent to [facility]. No orders of the defined types were placed in this encounter.  Patient to stay in quarantine till results are back. Then depending on symptoms of when he can come back. Must be fever free x 72 hours and all symptoms resolved within  10 days. Recommended he use inhaler with  4 puff q 6 hours prn SOB or wheezing.SOB and CP , difficulty breathing to go to the ER. Also informed patient that the 2 hour test is available through the Emergency Department at [facility]. He verbalizes understanding and has no questions at the end of our conversation.

## 2019-08-20 NOTE — Patient Instructions (Addendum)
This information is directly available on the CDC website: https://www.cdc.gov/coronavirus/2019-ncov/if-you-are-sick/steps-when-sick.html    Source:CDC Reference to specific commercial products, manufacturers, companies, or trademarks does not constitute its endorsement or recommendation by the U.S. Government, Department of Health and Human Services, or Centers for Disease Control and Prevention. This information is directly available on the CDC website: https://www.cdc.gov/coronavirus/2019-ncov/if-you-are-sick/steps-when-sick.html    Source:CDC Reference to specific commercial products, manufacturers, companies, or trademarks does not constitute its endorsement or recommendation by the U.S. Government, Department of Health and Human Services, or Centers for Disease Control and Prevention.  

## 2019-08-21 ENCOUNTER — Encounter: Payer: Self-pay | Admitting: Medical

## 2019-08-21 ENCOUNTER — Encounter (INDEPENDENT_AMBULATORY_CARE_PROVIDER_SITE_OTHER): Payer: Self-pay

## 2019-08-21 LAB — NOVEL CORONAVIRUS, NAA: SARS-CoV-2, NAA: NOT DETECTED

## 2019-08-22 ENCOUNTER — Encounter: Payer: Self-pay | Admitting: Medical

## 2019-08-22 ENCOUNTER — Encounter (INDEPENDENT_AMBULATORY_CARE_PROVIDER_SITE_OTHER): Payer: Self-pay

## 2019-08-24 ENCOUNTER — Encounter (INDEPENDENT_AMBULATORY_CARE_PROVIDER_SITE_OTHER): Payer: Self-pay

## 2019-08-25 ENCOUNTER — Encounter (INDEPENDENT_AMBULATORY_CARE_PROVIDER_SITE_OTHER): Payer: Self-pay

## 2019-10-05 ENCOUNTER — Other Ambulatory Visit: Payer: Managed Care, Other (non HMO)

## 2019-10-08 ENCOUNTER — Ambulatory Visit: Admit: 2019-10-08 | Payer: Managed Care, Other (non HMO) | Admitting: General Surgery

## 2019-10-08 SURGERY — COLONOSCOPY WITH PROPOFOL
Anesthesia: General

## 2019-11-02 ENCOUNTER — Other Ambulatory Visit
Admission: RE | Admit: 2019-11-02 | Discharge: 2019-11-02 | Disposition: A | Discharge status: Home / Self Care | Payer: Managed Care, Other (non HMO) | Source: Ambulatory Visit | Attending: General Surgery | Admitting: General Surgery

## 2019-11-02 ENCOUNTER — Other Ambulatory Visit: Payer: Self-pay

## 2019-11-02 DIAGNOSIS — Z01812 Encounter for preprocedural laboratory examination: Secondary | ICD-10-CM | POA: Diagnosis present

## 2019-11-02 DIAGNOSIS — Z20822 Contact with and (suspected) exposure to covid-19: Secondary | ICD-10-CM | POA: Insufficient documentation

## 2019-11-02 LAB — SARS CORONAVIRUS 2 (TAT 6-24 HRS): SARS Coronavirus 2: NEGATIVE

## 2019-11-03 ENCOUNTER — Other Ambulatory Visit: Payer: Managed Care, Other (non HMO)

## 2019-11-05 ENCOUNTER — Ambulatory Visit: Payer: Managed Care, Other (non HMO) | Admitting: Anesthesiology

## 2019-11-05 ENCOUNTER — Other Ambulatory Visit: Payer: Self-pay

## 2019-11-05 ENCOUNTER — Ambulatory Visit
Admission: RE | Admit: 2019-11-05 | Discharge: 2019-11-05 | Disposition: A | Discharge status: Home / Self Care | Payer: Managed Care, Other (non HMO) | Attending: General Surgery | Admitting: General Surgery

## 2019-11-05 ENCOUNTER — Encounter
Admission: RE | Disposition: A | Discharge status: Home / Self Care | Payer: Self-pay | Source: Home / Self Care | Attending: General Surgery

## 2019-11-05 DIAGNOSIS — D122 Benign neoplasm of ascending colon: Secondary | ICD-10-CM | POA: Insufficient documentation

## 2019-11-05 DIAGNOSIS — Z888 Allergy status to other drugs, medicaments and biological substances status: Secondary | ICD-10-CM | POA: Insufficient documentation

## 2019-11-05 DIAGNOSIS — Z96649 Presence of unspecified artificial hip joint: Secondary | ICD-10-CM | POA: Diagnosis not present

## 2019-11-05 DIAGNOSIS — Z79899 Other long term (current) drug therapy: Secondary | ICD-10-CM | POA: Diagnosis not present

## 2019-11-05 DIAGNOSIS — Z881 Allergy status to other antibiotic agents status: Secondary | ICD-10-CM | POA: Diagnosis not present

## 2019-11-05 DIAGNOSIS — Z86010 Personal history of colonic polyps: Secondary | ICD-10-CM | POA: Diagnosis not present

## 2019-11-05 DIAGNOSIS — K621 Rectal polyp: Secondary | ICD-10-CM | POA: Diagnosis not present

## 2019-11-05 DIAGNOSIS — Z6841 Body Mass Index (BMI) 40.0 and over, adult: Secondary | ICD-10-CM | POA: Insufficient documentation

## 2019-11-05 DIAGNOSIS — Z85528 Personal history of other malignant neoplasm of kidney: Secondary | ICD-10-CM | POA: Diagnosis not present

## 2019-11-05 DIAGNOSIS — Z1211 Encounter for screening for malignant neoplasm of colon: Secondary | ICD-10-CM | POA: Diagnosis present

## 2019-11-05 DIAGNOSIS — E669 Obesity, unspecified: Secondary | ICD-10-CM | POA: Diagnosis not present

## 2019-11-05 DIAGNOSIS — Z905 Acquired absence of kidney: Secondary | ICD-10-CM | POA: Diagnosis not present

## 2019-11-05 HISTORY — PX: COLONOSCOPY WITH PROPOFOL: SHX5780

## 2019-11-05 SURGERY — COLONOSCOPY WITH PROPOFOL
Anesthesia: General

## 2019-11-05 MED ORDER — PROPOFOL 500 MG/50ML IV EMUL
INTRAVENOUS | Status: DC | PRN
  Administered 2019-11-05: 150 ug/kg/min via INTRAVENOUS

## 2019-11-05 MED ORDER — PROPOFOL 10 MG/ML IV BOLUS
INTRAVENOUS | Status: AC
  Filled 2019-11-05: qty 20

## 2019-11-05 MED ORDER — LIDOCAINE 2% (20 MG/ML) 5 ML SYRINGE
INTRAMUSCULAR | Status: DC | PRN
  Administered 2019-11-05: 50 mg via INTRAVENOUS

## 2019-11-05 MED ORDER — PROPOFOL 10 MG/ML IV BOLUS
INTRAVENOUS | Status: DC | PRN
  Administered 2019-11-05: 30 mg via INTRAVENOUS
  Administered 2019-11-05: 80 mg via INTRAVENOUS

## 2019-11-05 MED ORDER — SODIUM CHLORIDE 0.9 % IV SOLN
INTRAVENOUS | Status: DC
  Administered 2019-11-05: 1000 mL via INTRAVENOUS

## 2019-11-05 NOTE — H&P (Signed)
Thomas Durham [id number] [birth year]     HPI:  Patient with past history of tubular adenoma in the rectum in 2011. For repeat colonoscopy.   Medications Prior to Admission  Medication Sig Dispense Refill Last Dose  . albuterol (PROVENTIL HFA;VENTOLIN HFA) 108 (90 Base) MCG/ACT inhaler Inhale 2 puffs into the lungs every 6 (six) hours as needed for wheezing or shortness of breath. 1 Inhaler 0 Past Week at Unknown time  . calcium carbonate (OS-CAL) 1250 (500 Ca) MG chewable tablet Chew 2 tablets by mouth every other day.    Past Week at Unknown time  . Difluprednate 0.05 % EMUL Administer 1 drop to the right eye 6XD.   Past Week at Unknown time  . HYDROcodone-homatropine (HYCODAN) 5-1.5 MG/5ML syrup Take 5 mLs by mouth every 6 (six) hours as needed for cough. 120 mL 0 Past Week at Unknown time  . sildenafil (VIAGRA) 100 MG tablet Take 0.5-1 tablets (50-100 mg total) by mouth daily as needed for erectile dysfunction. 10 tablet 3 Past Week at Unknown time   Allergies  Allergen Reactions  . Amoxicillin Rash  . Povidone Iodine Rash    Has used after without issue.    Past Medical History:  Diagnosis Date  . Cancer ([place])    left kidney  . Obesity   . Ruptured globe    Past Surgical History:  Procedure Laterality Date  . EYE SURGERY  2015-16   x5  . removal of kidney Left    due to cancer  . TONSILLECTOMY  1977  . TOTAL HIP ARTHROPLASTY  2014   Social History   Socioeconomic History  . Marital status: Married    Spouse name: Abigail Butts   . Number of children: 2  . Years of education: Not on file  . Highest education level: Some college, no degree  Occupational History  . Not on file  Tobacco Use  . Smoking status: Never Smoker  . Smokeless tobacco: Never Used  . Tobacco comment: patient's wife vapes  Substance and Sexual Activity  . Alcohol use: Yes    Alcohol/week: 12.0 - 15.0 standard drinks    Types: 12 - 15 Cans of beer per week  . Drug use: No  . Sexual activity: Yes     Partners: Female  Other Topics Concern  . Not on file  Social History Narrative  . Not on file   Social Determinants of Health   Financial Resource Strain: Low Risk   . Difficulty of Paying Living Expenses: Not hard at all  Food Insecurity: No Food Insecurity  . Worried About Charity fundraiser in the Last Year: Never true  . Ran Out of Food in the Last Year: Never true  Transportation Needs: No Transportation Needs  . Lack of Transportation (Medical): No  . Lack of Transportation (Non-Medical): No  Physical Activity: Sufficiently Active  . Days of Exercise per Week: 7 days  . Minutes of Exercise per Session: 120 min  Stress: Stress Concern Present  . Feeling of Stress : To some extent  Social Connections: Slightly Isolated  . Frequency of Communication with Friends and Family: More than three times a week  . Frequency of Social Gatherings with Friends and Family: Three times a week  . Attends Religious Services: 1 to 4 times per year  . Active Member of Clubs or Organizations: No  . Attends Archivist Meetings: Never  . Marital Status: Married  Human resources officer Violence: Not At Risk  .  Fear of Current or Ex-Partner: No  . Emotionally Abused: No  . Physically Abused: No  . Sexually Abused: No   Social History   Social History Narrative  . Not on file     ROS: Negative.     PE: HEENT: Negative. Lungs: Clear. Cardio: RR.   Assessment/Plan:  Proceed with planned endoscopy.  Forest Gleason [facility] 11/05/2019

## 2019-11-05 NOTE — Op Note (Signed)
[facility] Gastroenterology Patient Name: Thomas Durham Procedure Date: 11/05/2019 10:18 AM MRN: [id number] Account #: [id number] Date of Birth: 1961-02-27 Admit Type: Outpatient Age: 59 Room: [facility] ENDO ROOM 1 Gender: Male Note Status: Finalized Procedure:             Colonoscopy Indications:           High risk colon cancer surveillance: Personal history                         of colonic polyps Providers:             Robert Bellow, MD Medicines:             Monitored Anesthesia Care Complications:         No immediate complications. Procedure:             Pre-Anesthesia Assessment:                        - Prior to the procedure, a History and Physical was                         performed, and patient medications, allergies and                         sensitivities were reviewed. The patient's tolerance                         of previous anesthesia was reviewed.                        - The risks and benefits of the procedure and the                         sedation options and risks were discussed with the                         patient. All questions were answered and informed                         consent was obtained.                        After obtaining informed consent, the colonoscope was                         passed under direct vision. Throughout the procedure,                         the patient's blood pressure, pulse, and oxygen                         saturations were monitored continuously. The                         Colonoscope was introduced through the anus and                         advanced to the the cecum, identified by appendiceal  orifice and ileocecal valve. The colonoscopy was                         performed without difficulty. The patient tolerated                         the procedure well. The quality of the bowel                         preparation was good. Findings:      Two sessile  polyps were found in the rectum and ascending colon. The       polyps were 5 mm in size. Biopsies were taken with a cold forceps for       histology.      The retroflexed view of the distal rectum and anal verge was normal and       showed no anal or rectal abnormalities. Impression:            - Two 5 mm polyps in the rectum and in the ascending                         colon. Biopsied.                        - The distal rectum and anal verge are normal on                         retroflexion view.                        - The distal rectum and anal verge are normal on                         retroflexion view. Recommendation:        - Telephone endoscopist for pathology results in 1                         week. Procedure Code(s):     --- Professional ---                        [phone], Colonoscopy, flexible; with biopsy, single or                         multiple Diagnosis Code(s):     --- Professional ---                        Z86.010, Personal history of colonic polyps                        K62.1, Rectal polyp                        K63.5, Polyp of colon CPT copyright 2019 American Medical Association. All rights reserved. The codes documented in this report are preliminary and upon coder review may  be revised to meet current compliance requirements. Robert Bellow, MD 11/05/2019 10:57:26 AM This report has been signed electronically. Number of Addenda: 0 Note Initiated On: 11/05/2019 10:18 AM Scope Withdrawal Time: 0 hours 15 minutes 43 seconds  Total  Procedure Duration: 0 hours 20 minutes 34 seconds  Estimated Blood Loss:  Estimated blood loss: none.      [facility]

## 2019-11-05 NOTE — Anesthesia Postprocedure Evaluation (Signed)
Anesthesia Post Note  Patient: Thomas Durham  Procedure(s) Performed: COLONOSCOPY WITH PROPOFOL (N/A )  Patient location during evaluation: Endoscopy Anesthesia Type: General Level of consciousness: awake and alert Pain management: pain level controlled Vital Signs Assessment: post-procedure vital signs reviewed and stable Respiratory status: spontaneous breathing and respiratory function stable Cardiovascular status: stable Anesthetic complications: no     Last Vitals:  Vitals:   11/05/19 1104 11/05/19 1114  BP: 118/69   Pulse: 81 73  Resp: 18 20  Temp: 36.6 C   SpO2: 97% 93%    Last Pain:  Vitals:   11/05/19 1114  TempSrc:   PainSc: 0-No pain                 Souleymane Saiki K

## 2019-11-05 NOTE — Anesthesia Preprocedure Evaluation (Addendum)
Anesthesia Evaluation  Patient identified by MRN, date of birth, ID band Patient awake    Reviewed: Allergy & Precautions, NPO status , Patient's Chart, lab work & pertinent test results  History of Anesthesia Complications Negative for: history of anesthetic complications  Airway Mallampati: III       Dental   Pulmonary neg sleep apnea, neg COPD, Not current smoker,           Cardiovascular (-) hypertension(-) Past MI and (-) CHF (-) dysrhythmias (-) Valvular Problems/Murmurs     Neuro/Psych neg Seizures    GI/Hepatic Neg liver ROS, neg GERD  ,  Endo/Other  neg diabetesMorbid obesity  Renal/GU Renal disease (renal cell CA, s/p nephrectomy)     Musculoskeletal   Abdominal (+) + obese,   Peds  Hematology   Anesthesia Other Findings   Reproductive/Obstetrics                            Anesthesia Physical Anesthesia Plan  ASA: III  Anesthesia Plan: General   Post-op Pain Management:    Induction: Intravenous  PONV Risk Score and Plan: 2 and Propofol infusion and TIVA  Airway Management Planned: Nasal Cannula  Additional Equipment:   Intra-op Plan:   Post-operative Plan:   Informed Consent: I have reviewed the patients History and Physical, chart, labs and discussed the procedure including the risks, benefits and alternatives for the proposed anesthesia with the patient or authorized representative who has indicated his/her understanding and acceptance.       Plan Discussed with:   Anesthesia Plan Comments:         Anesthesia Quick Evaluation

## 2019-11-05 NOTE — Transfer of Care (Signed)
Immediate Anesthesia Transfer of Care Note  Patient: Thomas Durham  Procedure(s) Performed: COLONOSCOPY WITH PROPOFOL (N/A )  Patient Location: Endoscopy Unit  Anesthesia Type:General  Level of Consciousness: awake  Airway & Oxygen Therapy: Patient connected to nasal cannula oxygen  Post-op Assessment: Post -op Vital signs reviewed and stable  Post vital signs: stable  Last Vitals:  Vitals Value Taken Time  BP 118/69 11/05/19 1106  Temp 36.6 C 11/05/19 1104  Pulse 78 11/05/19 1106  Resp 15 11/05/19 1106  SpO2 95 % 11/05/19 1106  Vitals shown include unvalidated device data.  Last Pain:  Vitals:   11/05/19 1104  TempSrc:   PainSc: Asleep         Complications: No apparent anesthesia complications

## 2019-11-08 ENCOUNTER — Encounter: Payer: Self-pay | Admitting: *Deleted

## 2019-11-09 LAB — SURGICAL PATHOLOGY

## 2020-02-16 ENCOUNTER — Other Ambulatory Visit: Payer: Self-pay

## 2020-02-16 ENCOUNTER — Encounter: Payer: Self-pay | Admitting: Physician Assistant

## 2020-02-16 ENCOUNTER — Ambulatory Visit: Payer: Managed Care, Other (non HMO) | Admitting: Physician Assistant

## 2020-02-16 VITALS — BP 140/84 | HR 72 | Temp 96.0°F | Resp 18 | Ht 68.0 in | Wt 300.0 lb

## 2020-02-16 DIAGNOSIS — Z008 Encounter for other general examination: Secondary | ICD-10-CM | POA: Diagnosis not present

## 2020-02-16 NOTE — Progress Notes (Signed)
Subjective:    Patient ID: Thomas Durham, male    DOB: Apr 09, 1961, 59 y.o.   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  Promotion was welcomed but the position change  has been responsible for a dramatic drop in walking during the day and much more desk time. He was startled and concerned about weight 300 this morning- and believes he was 287 in an April medical exam.  5'8" Stopped wearing gun belt as weight and pressure made low back hurt  Hx includes loss of one kidney to Renal Cell Ca years ago Right eye surgery- ruptured globe BP reported wnl to this point Morbid obesity  Mother w lung cancer and bladder cancer Father HTN, CVA Brother Stage 4 lung Ca  Review of Systems As noted above    Objective:   Physical Exam Vitals and nursing note reviewed.  Constitutional:      General: He is not in acute distress.    Appearance: He is obese.     Comments: Alert interactive, very pleasant - expressing real concern about escalating weight and its implications  HENT:     Head: Normocephalic and atraumatic.     Right Ear: Tympanic membrane, ear canal and external ear normal.     Left Ear: Tympanic membrane, ear canal and external ear normal.     Nose: Nose normal.     Mouth/Throat:     Mouth: Mucous membranes are moist.     Comments: DDS  6 mos dentition in good repair Eyes:     Conjunctiva/sclera: Conjunctivae normal.     Comments: Right eye with lax lid; size impression  smaller than left  Cardiovascular:     Rate and Rhythm: Normal rate and regular rhythm.     Pulses: Normal pulses.  Pulmonary:     Effort: Pulmonary effort is normal. No respiratory distress.     Breath sounds: Normal breath sounds.  Abdominal:     General: Bowel sounds are normal.     Tenderness: There is no abdominal tenderness.     Comments: Morbid obesity, protuberant  Genitourinary:    Comments: Defer,difficulty denied Musculoskeletal:        General: Normal  range of motion.     Cervical back: Normal range of motion and neck supple.     Right lower leg: Edema present.     Left lower leg: Edema present.     Comments: Trace edema bilaterally- has noted some late in day on occasion-recent onset  Lymphadenopathy:     Cervical: No cervical adenopathy.  Skin:    General: Skin is warm and dry.     Capillary Refill: Capillary refill takes less than 2 seconds.  Neurological:     General: No focal deficit present.     Mental Status: He is alert.  Psychiatric:        Mood and Affect: Mood normal.        Behavior: Behavior normal.       Assessment & Plan:  Discussed healthy food choices, portion control , push back, luncheon plates - ambulate minimum of 30 minutes per day-  Slightly elevated heart rate and respiratory rate. Steady medium output instead of brief high intensity- . Persistence/consistence For longer period following exercise encouraged- 45 -60  minutes can increase BMR for extended post output response. Goal 1/2 to 1 pound per week for steady weight loss. Salt restriction, empty calories, not a fast  food junkie by his report Encouraged consult with PCP for support and follow up weight checks/management. Questions fielded, recommendations reviewed-   Labs to be reported as available

## 2020-02-17 LAB — GLUCOSE, RANDOM: Glucose: 71 mg/dL (ref 65–99)

## 2020-02-17 LAB — LIPID PANEL
Chol/HDL Ratio: 3.2 ratio (ref 0.0–5.0)
Cholesterol, Total: 118 mg/dL (ref 100–199)
HDL: 37 mg/dL — ABNORMAL LOW
LDL Chol Calc (NIH): 62 mg/dL (ref 0–99)
Triglycerides: 103 mg/dL (ref 0–149)
VLDL Cholesterol Cal: 19 mg/dL (ref 5–40)

## 2020-03-18 IMAGING — CR DG CHEST 2V
1 series · 2 of 2 positions shown · non-contrast
Comparison: 11/23/2018

CLINICAL DATA: Cough and congestion

EXAM:
CHEST - 2 VIEW

[Series 1: dg chest 2 view · 0.14mm/px · 2 of 2 slices shown]
[im 1/2]
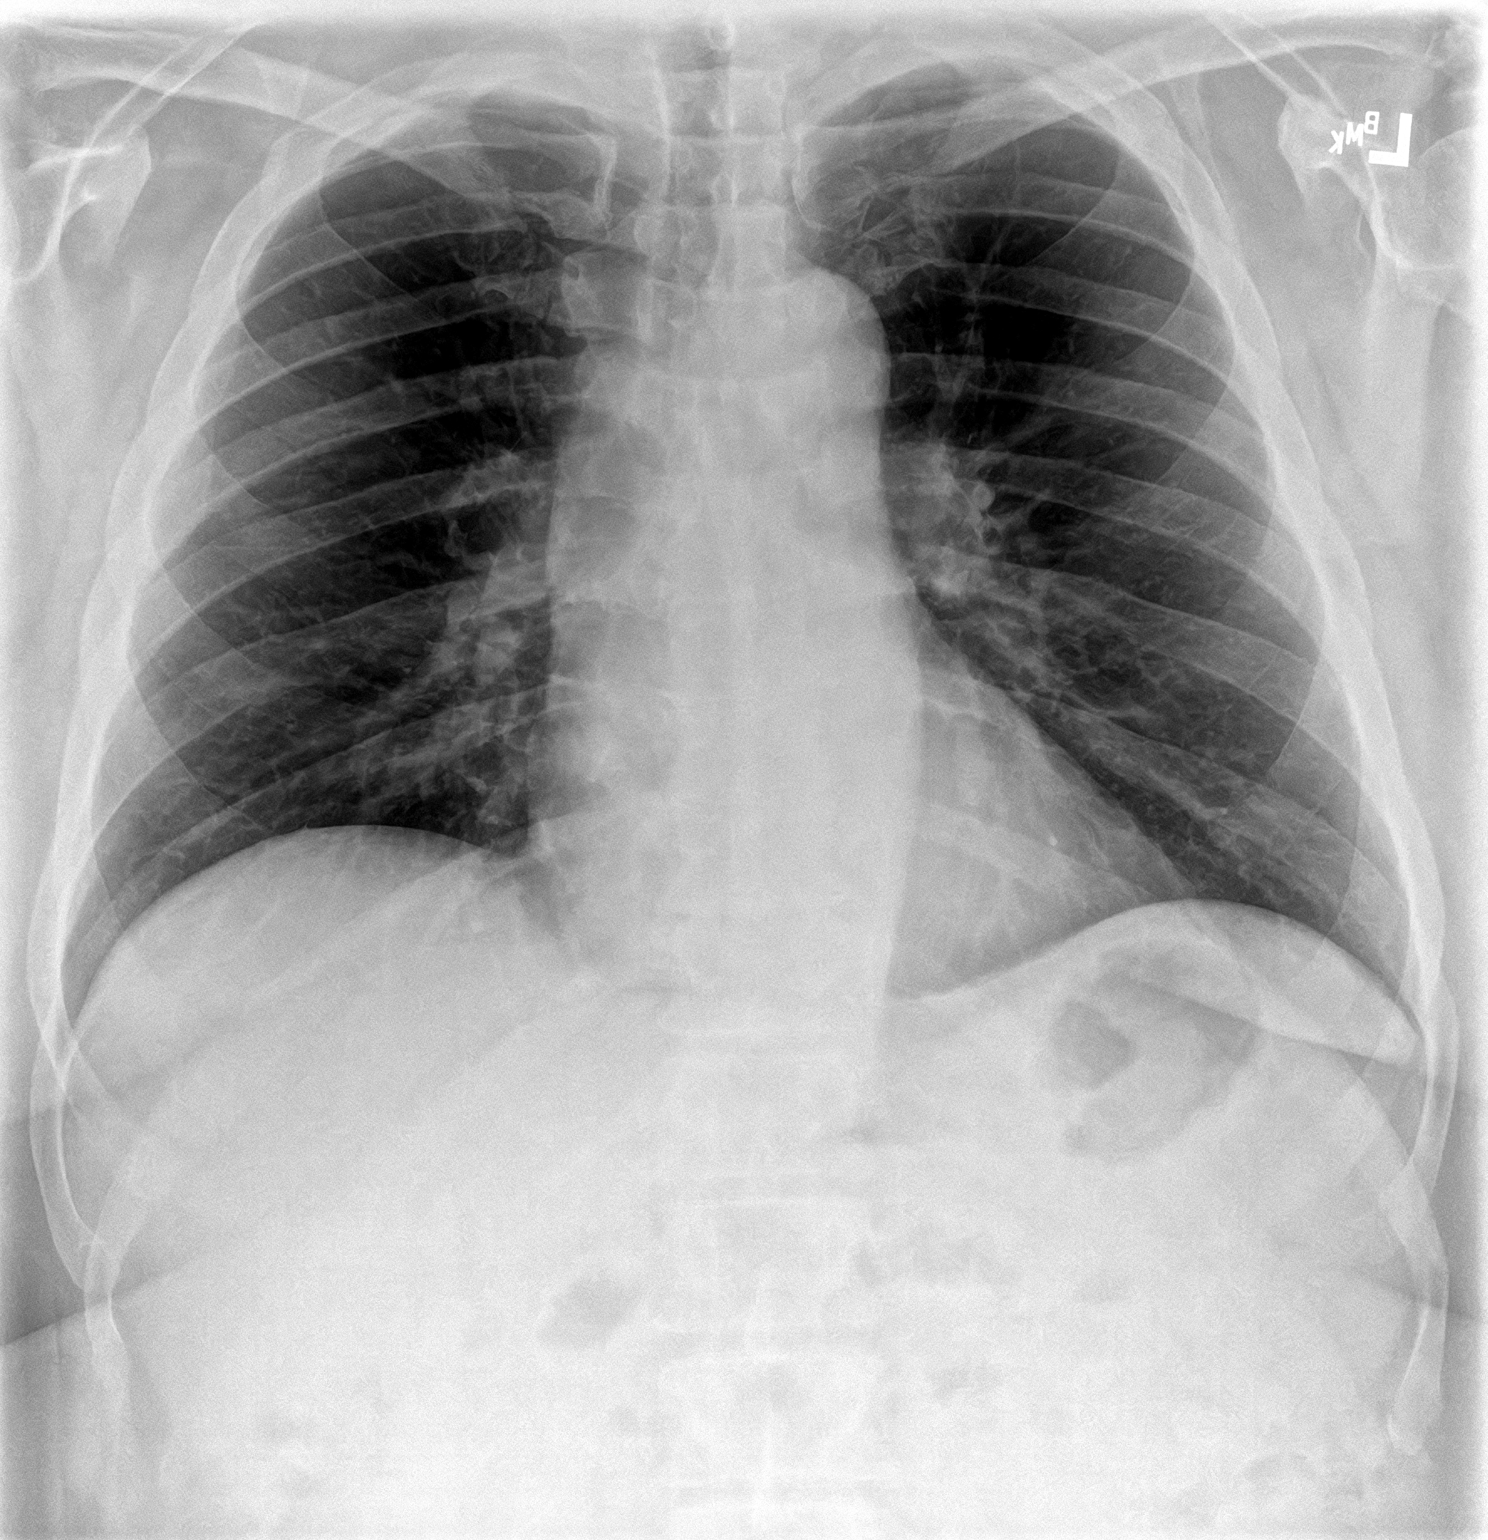
[im 2/2]
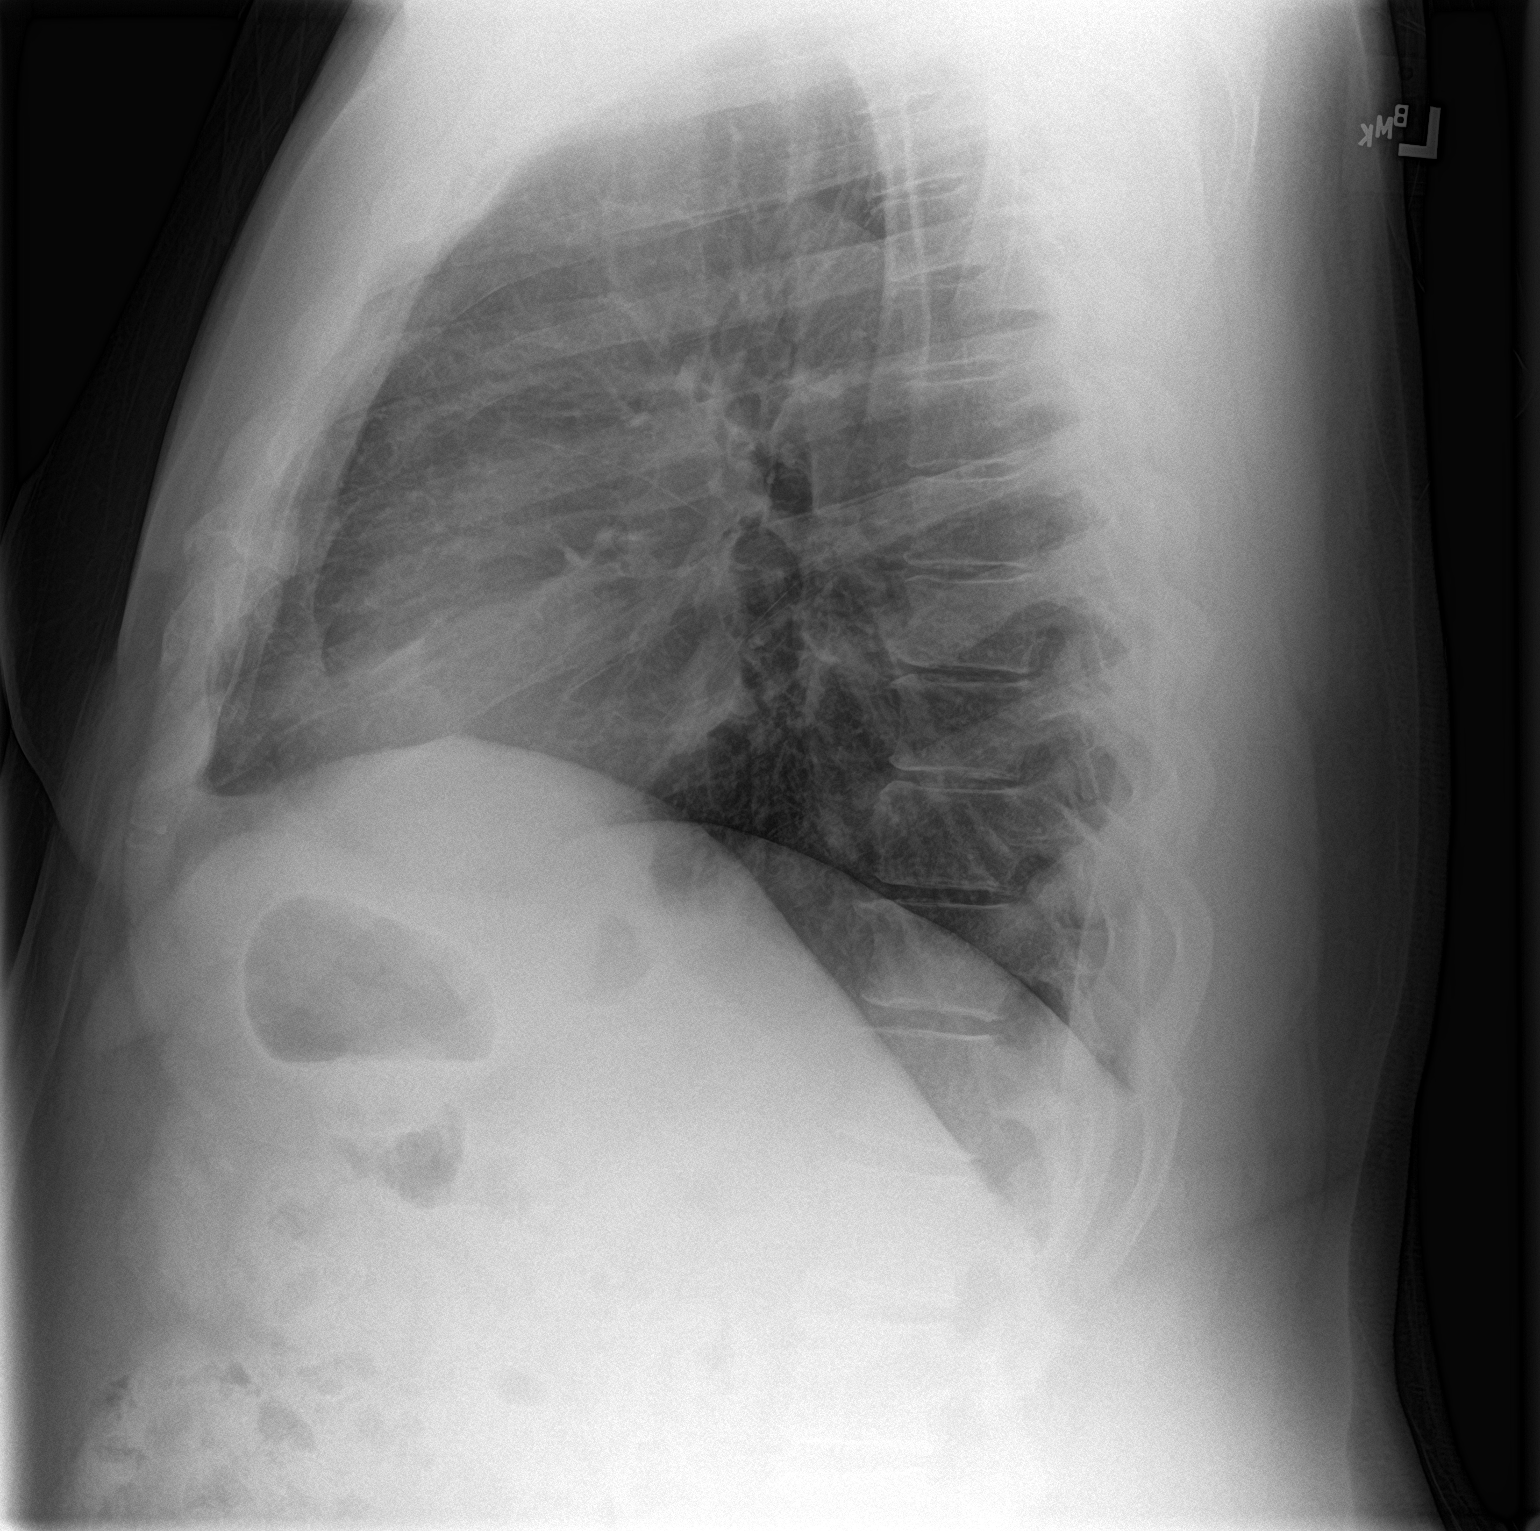

[2 of 2 positions shown; findings below may reference images not displayed]

FINDINGS: The heart size and mediastinal contours are within normal limits.
Both lungs are clear. The visualized skeletal structures are
unremarkable.
IMPRESSION: No active cardiopulmonary disease.

## 2020-08-30 NOTE — Addendum Note (Signed)
Addended by: Jay Schlichter D on: 08/30/2020 02:54 PM   Modules accepted: Level of Service

## 2020-11-21 ENCOUNTER — Ambulatory Visit: Payer: Managed Care, Other (non HMO) | Admitting: Nurse Practitioner

## 2021-03-13 ENCOUNTER — Ambulatory Visit: Payer: Managed Care, Other (non HMO) | Admitting: Physician Assistant

## 2021-03-13 ENCOUNTER — Encounter: Payer: Self-pay | Admitting: Physician Assistant

## 2021-03-13 VITALS — BP 152/84 | HR 70 | Temp 97.7°F | Resp 16 | Ht 68.0 in | Wt 304.0 lb

## 2021-03-13 DIAGNOSIS — Z0000 Encounter for general adult medical examination without abnormal findings: Secondary | ICD-10-CM | POA: Diagnosis not present

## 2021-03-13 DIAGNOSIS — Z008 Encounter for other general examination: Secondary | ICD-10-CM

## 2021-03-13 LAB — GLUCOSE, POCT (MANUAL RESULT ENTRY): POC Glucose: 107 mg/dl — AB (ref 70–99)

## 2021-03-13 NOTE — Patient Instructions (Signed)

## 2021-03-13 NOTE — Progress Notes (Signed)
Subjective:    Patient ID: Thomas Durham, male    DOB: 1960/11/26, 60 y.o.   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 He is pleased with the advance but recognizes the decreased walking and increased desk time has made it particularly difficult to readdress successful weight loss. In 2020 was able to achieve 277.   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 Has been reporting for abdominopelvic screening studies every 6 months and to this point has not had any recurrence  of disease. Will now be able to stretch visits to every 6 months.  He is also being followed for a lesion in in his lung which appears stable and may be as remote as a previous pneumonia years ago. Lesion unchanged for duration .  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  Constitutional:      Appearance: He is obese.  HENT:     Head: Normocephalic.     Right Ear: Tympanic membrane, ear canal and external ear normal.     Left Ear: Tympanic membrane, ear canal and external ear normal.     Ears:     Comments: Daily Q tips    Nose: Nose normal.     Mouth/Throat:     Mouth: Mucous membranes are moist.     Comments: DDS q 6 months  Good repair Eyes:     Extraocular Movements: Extraocular movements intact.     Comments: Right eye , partially corrected lid lag   Neck:     Comments: Bull neck, no thyromegaly identified Cardiovascular:     Rate and Rhythm: Normal rate and regular rhythm.     Pulses: Normal pulses.  Pulmonary:     Effort: Pulmonary effort is normal. No respiratory distress.     Breath sounds: Normal breath sounds. No wheezing.  Abdominal:     General: Bowel sounds are normal.     Tenderness: There is no abdominal  tenderness. There is no right CVA tenderness, left CVA tenderness or guarding.     Comments: Morbidly obese  Genitourinary:    Comments: defer Musculoskeletal:        General: Normal range of motion.     Cervical back: Normal range of motion and neck supple. No tenderness.  Lymphadenopathy:     Cervical: No cervical adenopathy.  Skin:    General: Skin is warm and dry.     Capillary Refill: Capillary refill takes less than 2 seconds.  Neurological:     General: No focal deficit present.     Mental Status: He is alert.     Cranial Nerves: No cranial nerve deficit.     Deep Tendon Reflexes: Reflexes normal.  Psychiatric:        Mood and Affect: Mood normal.        Behavior: Behavior normal.       Assessment & Plan:  Whitman and I reviewed guidelines and techniques for limiting intake calories  And applying walking exercise as regularly as we pick up a fork.  Goal 30 minutes brisk walk daily- elevated heart rate and respirations- will require  Practice and toning to achieve. Reviewed Reviewed  non food empty calories and cumulative effect - 12-15 /week  Lab draw difficult. CBG reported 107 on site. Limited specimen submitted. To be reported as available Questions fielded , recommendations reviewed-  DASH diet to be reviewed for portion control and choices

## 2021-03-15 LAB — LIPID PANEL

## 2021-03-26 ENCOUNTER — Other Ambulatory Visit: Payer: Self-pay | Admitting: Physician Assistant

## 2021-05-22 ENCOUNTER — Telehealth (INDEPENDENT_AMBULATORY_CARE_PROVIDER_SITE_OTHER): Payer: Managed Care, Other (non HMO) | Admitting: Unknown Physician Specialty

## 2021-05-22 ENCOUNTER — Encounter: Payer: Self-pay | Admitting: Unknown Physician Specialty

## 2021-05-22 ENCOUNTER — Ambulatory Visit: Payer: Self-pay | Admitting: *Deleted

## 2021-05-22 ENCOUNTER — Other Ambulatory Visit: Payer: Self-pay

## 2021-05-22 DIAGNOSIS — U071 COVID-19: Secondary | ICD-10-CM | POA: Diagnosis not present

## 2021-05-22 MED ORDER — NIRMATRELVIR/RITONAVIR (PAXLOVID)TABLET: 3.0000 | ORAL_TABLET | Freq: Two times a day (BID) | ORAL | 0 refills | Status: AC

## 2021-05-22 NOTE — Telephone Encounter (Signed)
Pt called in c/o being positive for Covid.   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  He started feeling bad yesterday afternoon and last night he had terrible body aches, chills, coughing and feels just terrible this morning.   They tested him at the jail and he was positive.  (Employed by jail)  He does not have any underlying health conditions other than having his kidney removed back in 2019.    "I'm obese at almost 300 lbs otherwise no other health conditions".  I made him a MyChart video visit with Kathrine Haddock, NP at [facility] for today at 11:20.     I sent my notes to [facility].

## 2021-05-22 NOTE — Telephone Encounter (Signed)
Reason for Disposition  [1] Continuous (nonstop) coughing interferes with work or school AND [2] no improvement using cough treatment per Care Advice  Answer Assessment - Initial Assessment Questions 1. COVID-19 DIAGNOSIS: "Who made your COVID-19 diagnosis?" "Was it confirmed by a positive lab test or self-test?" If not diagnosed by a doctor (or NP/PA), ask "Are there lots of cases (community spread) where you live?" Note: See public health department website, if unsure.     Tested at the jail this morning at 7:40 and I'm positive for Covid.    2. COVID-19 EXPOSURE: "Was there any known exposure to COVID before the symptoms began?" CDC Definition of close contact: within 6 feet (2 meters) for a total of 15 minutes or more over a 24-hour period.      *No Answer* 3. ONSET: "When did the COVID-19 symptoms start?"      I started yesterday feeling bad.   I'm wheezing, body aches, I feel hot, chills, coughing dry hacking, runny nose and post nasal drip, sore throat,  4. WORST SYMPTOM: "What is your worst symptom?" (e.g., cough, fever, shortness of breath, muscle aches)     coughing 5. COUGH: "Do you have a cough?" If Yes, ask: "How bad is the cough?"       Yes dry cough 6. FEVER: "Do you have a fever?" If Yes, ask: "What is your temperature, how was it measured, and when did it start?"     I think so 7. RESPIRATORY STATUS: "Describe your breathing?" (e.g., shortness of breath, wheezing, unable to speak)      Wheezing     When I breath in it harder.    No lung history.   I had my left kidney removed a few yrs ago.   No heart problems.  8. BETTER-SAME-WORSE: "Are you getting better, staying the same or getting worse compared to yesterday?"  If getting worse, ask, "In what way?"     worse 9. HIGH RISK DISEASE: "Do you have any chronic medical problems?" (e.g., asthma, heart or lung disease, weak immune system, obesity, etc.)     No medical history other than I'm obese and I've had my kidney removed in  2019.   10. VACCINE: "Have you had the COVID-19 vaccine?" If Yes, ask: "Which one, how many shots, when did you get it?"       Not asked 11. BOOSTER: "Have you received your COVID-19 booster?" If Yes, ask: "Which one and when did you get it?"       Not asked 12. PREGNANCY: "Is there any chance you are pregnant?" "When was your last menstrual period?"       N/A 13. OTHER SYMPTOMS: "Do you have any other symptoms?"  (e.g., chills, fatigue, headache, loss of smell or taste, muscle pain, sore throat)       Chills, body aches, sore throat, cough,  14. O2 SATURATION MONITOR:  "Do you use an oxygen saturation monitor (pulse oximeter) at home?" If Yes, ask "What is your reading (oxygen level) today?" "What is your usual oxygen saturation reading?" (e.g., 95%)       No  Protocols used: Coronavirus (COVID-19) Diagnosed or Suspected-A-AH

## 2021-05-22 NOTE — Progress Notes (Signed)
There were no vitals taken for this visit.   Subjective:    Patient ID: Thomas Durham, male    DOB: 12/16/1960, 60 y.o.   MRN: [id number]  HPI: Thomas Durham is a 60 y.o. male  Chief Complaint  Patient presents with   Covid Positive    Headache, chills, SOB,sore throat since yesterday   Pt reports the above symptoms since yesterday.  Reports SOB is related to sinus drainage.  Positive through test at work  [facility] Note  I connected with Thomas Durham on 05/22/2021/10:12 AM by Deloris Ping and verified that I am speaking with the correct person using two identifiers.  I discussed the limitations, risks, security, and privacy concerns of performing an evaluation and management service by telephone and the availability of in person appointments. I also discussed with the patient that there may be a patient responsible charge related to this service. The patient expressed understanding and agreed to proceed.  Patient location: home Provider location: work  Diagnosis: COVID-19 infection  Purpose of visit: Discussion of potential use of Molnupiravir or Paxlovid, a new treatment for mild to moderate COVID-19 viral infection in non-hospitalized patients.   Subjective: Patient is a 60 y.o. male who has been diagnosed with COVID 19 viral infection.  Their symptoms began on 8/1  Past Medical History:  Diagnosis Date   Cancer ([place])    left kidney   Obesity    Ruptured globe     Allergies  Allergen Reactions   Amoxicillin Rash   Povidone Iodine Rash    Has used after without issue.      Current Outpatient Medications:    sildenafil (VIAGRA) 100 MG tablet, Take 0.5-1 tablets (50-100 mg total) by mouth daily as needed for erectile dysfunction., Disp: 10 tablet, Rfl: 3   albuterol (PROVENTIL HFA;VENTOLIN HFA) 108 (90 Base) MCG/ACT inhaler, Inhale 2 puffs into the lungs every 6 (six) hours as needed for wheezing or shortness of breath. (Patient not taking: No  sig reported), Disp: 1 Inhaler, Rfl: 0   calcium carbonate (OS-CAL) 1250 (500 Ca) MG chewable tablet, Chew 2 tablets by mouth every other day.  (Patient not taking: No sig reported), Disp: , Rfl:    Difluprednate 0.05 % EMUL, Administer 1 drop to the right eye 6XD. (Patient not taking: No sig reported), Disp: , Rfl:    HYDROcodone-homatropine (HYCODAN) 5-1.5 MG/5ML syrup, Take 5 mLs by mouth every 6 (six) hours as needed for cough. (Patient not taking: No sig reported), Disp: 120 mL, Rfl: 0  Objective: Patient appears/sounds congested.  They are in no apparent distress.  Breathing is non labored.  Mood and behavior are normal.  Laboratory Data:  Recent Results (from the past 2160 hour(s))  POCT Glucose (CBG)-Manual entry (CPT [phone])     Status: Abnormal   Collection Time: 03/13/21  4:24 PM  Result Value Ref Range   POC Glucose 107 (A) 70 - 99 mg/dl    Comment: Notified Provider   Lipid panel     Status: None   Collection Time: 03/13/21  4:25 PM  Result Value Ref Range   Cholesterol, Total CANCELED mg/dL    Comment: Test not performed. Insufficient specimen to perform or complete analysis.       Francetta Found. was notified 03/15/2021.  Result canceled by the ancillary.    Triglycerides CANCELED     Comment: Test not performed  Result canceled by the ancillary.    HDL CANCELED  Comment: Test not performed  Result canceled by the ancillary.    VLDL Cholesterol Cal CANCELED mg/dL    Comment: Unable to calculate result since non-numeric result obtained for component test.  Result canceled by the ancillary.      Assessment: 60 y.o. male with mild/moderate COVID 19 viral infection diagnosed on 8/1 at high risk for progression to severe COVID 19.  Plan:  This patient is a 60 y.o. male that meets the following criteria for Emergency Use Authorization of: Paxlovid 1. Age >12 yr AND > 40 kg 2. SARS-COV-2 positive test 3. Symptom onset < 5 days 4. Mild-to-moderate COVID disease with  high risk for severe progression to hospitalization or death  I have spoken and communicated the following to the patient or parent/caregiver regarding: Paxlovid is an unapproved drug that is authorized for use under an Emergency Use Authorization.  There are no adequate, approved, available products for the treatment of COVID-19 in adults who have mild-to-moderate COVID-19 and are at high risk for progressing to severe COVID-19, including hospitalization or death. Other therapeutics are currently authorized. For additional information on all products authorized for treatment or prevention of COVID-19, please see [employer].  There are benefits and risks of taking this treatment as outlined in the "Fact Sheet for Patients and Caregivers."  "Fact Sheet for Patients and Caregivers" was reviewed with patient. A hard copy will be provided to patient from pharmacy prior to the patient receiving treatment. Patients should continue to self-isolate and use infection control measures (e.g., wear mask, isolate, social distance, avoid sharing personal items, clean and disinfect "high touch" surfaces, and frequent handwashing) according to CDC guidelines.  The patient or parent/caregiver has the option to accept or refuse treatment. Patient medication history was reviewed for potential drug interactions:No drug interactions Patient's GFR was calculated to be >60, and they were therefore prescribed Normal dose (GFR>60) - nirmatrelvir '150mg'$  tab (2 tablet) by mouth twice daily AND ritonavir '100mg'$  tab (1 tablet) by mouth twice daily  After reviewing above information with the patient, the patient agrees to receive Paxlovid.  Follow up instructions:    Take prescription BID x 5 days as directed Reach out to pharmacist for counseling on medication if desired For concerns regarding further COVID symptoms  please follow up with your PCP or urgent care For urgent or life-threatening issues, seek care at your local emergency department Stop Sildenafil  The patient was provided an opportunity to ask questions, and all were answered. The patient agreed with the plan and demonstrated an understanding of the instructions.    The patient was advised to call their PCP or seek an in-person evaluation if the symptoms worsen or if the condition fails to improve as anticipated.   I provided 20 minutes of face-to-face video visit time during this encounter, and > 50% was spent counseling as documented under my assessment & plan.  Kathrine Haddock, NP 05/22/2021 /10:12 AM

## 2021-05-22 NOTE — Patient Instructions (Signed)
You are being prescribed PAXLOVID for COVID-19 infection.    Medications to hold while taking this treatment: Sildenafil *If asked to hold, you can resume them 24 hours after your last dose   ADMINISTRATION INSTRUCTIONS: Take with or without food. Swallow the tablets whole. Don't chew, crush, or break the medications because it might not work as well  For each dose of the medication, you should be taking 3 tablets twice a day  Finish your full five-day course of Paxlovid even if you feel better before you're done. Stopping this medication too early can make it less effective to prevent severe illness related to Thomas Durham.    Paxlovid is prescribed for YOU ONLY. Don't share it with others, even if they have similar symptoms as you. This medication might not be right for everyone.  Make sure to take steps to protect yourself and others while you're taking this medication in order to get well soon and to prevent others from getting sick with COVID-19.  Paxlovid (nirmatrelvir / ritonavir) can cause hormonal birth control medications to not work well. If you or your partner is currently taking hormonal birth control, use condoms or other birth control methods to prevent unintended pregnancies.    COMMON SIDE EFFECTS: Altered or bad taste in your mouth  Diarrhea  High blood pressure (1% of people) Muscle aches (1% of people)     If your COVID-19 symptoms get worse, get medical help right away. Call 911 if you experience symptoms such as worsening cough, trouble breathing, chest pain that doesn't go away, confusion, a hard time staying awake, and pale or blue-colored skin. This medication won't prevent all COVID-19 cases from getting worse.

## 2022-11-21 ENCOUNTER — Other Ambulatory Visit: Payer: Self-pay

## 2022-11-21 ENCOUNTER — Encounter: Payer: Self-pay | Admitting: Nurse Practitioner

## 2022-11-21 ENCOUNTER — Ambulatory Visit: Payer: Managed Care, Other (non HMO) | Admitting: Nurse Practitioner

## 2022-11-21 ENCOUNTER — Ambulatory Visit
Admission: RE | Admit: 2022-11-21 | Discharge: 2022-11-21 | Disposition: A | Discharge status: Home / Self Care | Payer: Managed Care, Other (non HMO) | Source: Ambulatory Visit | Attending: Nurse Practitioner | Admitting: Nurse Practitioner

## 2022-11-21 ENCOUNTER — Ambulatory Visit
Admission: RE | Admit: 2022-11-21 | Discharge: 2022-11-21 | Disposition: A | Discharge status: Home / Self Care | Payer: Managed Care, Other (non HMO) | Attending: Nurse Practitioner | Admitting: Nurse Practitioner

## 2022-11-21 ENCOUNTER — Ambulatory Visit: Payer: Managed Care, Other (non HMO) | Admitting: Physician Assistant

## 2022-11-21 VITALS — BP 130/70 | HR 90 | Temp 98.0°F | Resp 16 | Ht 68.0 in | Wt 302.2 lb

## 2022-11-21 DIAGNOSIS — M25562 Pain in left knee: Secondary | ICD-10-CM | POA: Diagnosis present

## 2022-11-21 MED ORDER — TRAMADOL HCL 50 MG PO TABS: 50.0000 mg | ORAL_TABLET | Freq: Three times a day (TID) | ORAL | 0 refills | Status: AC | PRN

## 2022-11-21 NOTE — Progress Notes (Signed)
BP 130/70   Pulse 90   Temp 98 F (36.7 C) (Oral)   Resp 16   Ht '5\' 8"'$  (1.727 m)   Wt (!) 302 lb 3.2 oz (137.1 kg)   SpO2 97%   BMI 45.95 kg/m    Subjective:    Patient ID: Thomas Durham, male    DOB: Jun 18, 1961, 62 y.o.   MRN: [id number]  HPI: Thomas Durham is a 62 y.o. male  Chief Complaint  Patient presents with   Knee Pain    Left knee pain for 1 weeks, swelling   Left knee pain: patient reports he had left knee pain and swelling for about a week. He says he has fallen on that knee at least three times. He says he just tripped when he was out in the woods and fell on his left knee.  He says it did not hurt when he fell on it but did later that day. He has been icing it and he has been taking ibuprofen.  He reports that it helps a little.  He reports his pain as a 6/10.  He describes the pain as an ache. He has full range of motion, there is some minor swelling but not warmth.   Will get xray. Discussed with patient to rest, ice, compression and elevation. Will send in short course of tramadol. ( He only has one kidney).   Relevant past medical, surgical, family and social history reviewed and updated as indicated. Interim medical history since our last visit reviewed. Allergies and medications reviewed and updated.  Review of Systems  Constitutional: Negative for fever or weight change.  Respiratory: Negative for cough and shortness of breath.   Cardiovascular: Negative for chest pain or palpitations.  Gastrointestinal: Negative for abdominal pain, no bowel changes.  Musculoskeletal: Negative for gait problem , positive joint swelling, positive knee pain Skin: Negative for rash.  Neurological: Negative for dizziness or headache.  No other specific complaints in a complete review of systems (except as listed in HPI above).      Objective:    BP 130/70   Pulse 90   Temp 98 F (36.7 C) (Oral)   Resp 16   Ht '5\' 8"'$  (1.727 m)   Wt (!) 302 lb 3.2 oz (137.1 kg)    SpO2 97%   BMI 45.95 kg/m   Wt Readings from Last 3 Encounters:  11/21/22 (!) 302 lb 3.2 oz (137.1 kg)  03/13/21 (!) 304 lb (137.9 kg)  02/16/20 300 lb (136.1 kg)    Physical Exam  Constitutional: Patient appears well-developed and well-nourished. Obese  No distress.  HEENT: head atraumatic, normocephalic, pupils equal and reactive to light, neck supple Cardiovascular: Normal rate, regular rhythm and normal heart sounds.  No murmur heard. No BLE edema. Pulmonary/Chest: Effort normal and breath sounds normal. No respiratory distress. Abdominal: Soft.  There is no tenderness. MSK: left knee swelling, no heat, no redness, no decrease ROM Psychiatric: Patient has a normal mood and affect. behavior is normal. Judgment and thought content normal.   Results for orders placed or performed in visit on 03/13/21  Lipid panel  Result Value Ref Range   Cholesterol, Total CANCELED mg/dL   Triglycerides CANCELED    HDL CANCELED    VLDL Cholesterol Cal CANCELED mg/dL  POCT Glucose (CBG)-Manual entry (CPT 95188)  Result Value Ref Range   POC Glucose 107 (A) 70 - 99 mg/dl      Assessment & Plan:   Problem List  Items Addressed This Visit   None Visit Diagnoses     Acute pain of left knee    -  Primary   get xray, start tramadol as needed for pain, RICE (rest, ice, compression, and elevation).  may need to send to ortho if no improvement.   Relevant Medications   traMADol (ULTRAM) 50 MG tablet   Other Relevant Orders   DG Knee Complete 4 Views Left        Follow up plan: Return for has not been seen for chronic problems in a long time, needs to schedule appointment .

## 2022-11-22 ENCOUNTER — Other Ambulatory Visit: Payer: Self-pay | Admitting: Nurse Practitioner

## 2022-11-22 DIAGNOSIS — M25562 Pain in left knee: Secondary | ICD-10-CM

## 2023-03-06 ENCOUNTER — Ambulatory Visit: Payer: Managed Care, Other (non HMO) | Admitting: Nurse Practitioner

## 2023-03-06 ENCOUNTER — Other Ambulatory Visit: Payer: Self-pay

## 2023-03-06 VITALS — BP 138/76 | HR 97 | Temp 98.5°F | Resp 18 | Ht 68.0 in | Wt 303.8 lb

## 2023-03-06 DIAGNOSIS — M545 Low back pain, unspecified: Secondary | ICD-10-CM | POA: Diagnosis not present

## 2023-03-06 MED ORDER — CYCLOBENZAPRINE HCL 10 MG PO TABS: 10.0000 mg | ORAL_TABLET | Freq: Three times a day (TID) | ORAL | 0 refills | Status: DC | PRN

## 2023-03-06 NOTE — Progress Notes (Signed)
BP 138/76   Pulse 97   Temp 98.5 F (36.9 C) (Oral)   Resp 18   Ht 5\' 8"  (1.727 m)   Wt (!) 303 lb 12.8 oz (137.8 kg)   SpO2 96%   BMI 46.19 kg/m    Subjective:    Patient ID: Thomas Durham, male    DOB: March 28, 1961, 62 y.o.   MRN: [id number]  HPI: Thomas Durham is a 62 y.o. male  Chief Complaint  Patient presents with   Back Pain    Low back pain for 3 months   Back pain: lower back pain off and on for about three months. He says it is worse with movement.  He denies any trauma. He says that activity can make it worse.  He says that he took his wife's muscle relaxer did help.  He denies any numbness or tingling. He denies any incontinence.  He denies any dysuria.  He denies any fever.  He has taken tylenol to help with the pain.  He induces pain with straight leg test.  Will give prescription of flexeril for muscle relaxer, continue tylenol for pain, rest and use heat therapy. If no improvement may obtain lumbar xray and refer to physical therapy.  Patient agreeable to plan.   Relevant past medical, surgical, family and social history reviewed and updated as indicated. Interim medical history since our last visit reviewed. Allergies and medications reviewed and updated.  Review of Systems  Constitutional: Negative for fever or weight change.  Respiratory: Negative for cough and shortness of breath.   Cardiovascular: Negative for chest pain or palpitations.  Gastrointestinal: Negative for abdominal pain, no bowel changes.  Musculoskeletal: Negative for gait problem or joint swelling. Positive for lower back pain Skin: Negative for rash.  Neurological: Negative for dizziness or headache.  No other specific complaints in a complete review of systems (except as listed in HPI above).      Objective:    BP 138/76   Pulse 97   Temp 98.5 F (36.9 C) (Oral)   Resp 18   Ht 5\' 8"  (1.727 m)   Wt (!) 303 lb 12.8 oz (137.8 kg)   SpO2 96%   BMI 46.19 kg/m   Wt Readings from  Last 3 Encounters:  03/06/23 (!) 303 lb 12.8 oz (137.8 kg)  11/21/22 (!) 302 lb 3.2 oz (137.1 kg)  03/13/21 (!) 304 lb (137.9 kg)    Physical Exam  Constitutional: Patient appears well-developed and well-nourished. Obese  No distress.  HEENT: head atraumatic, normocephalic, pupils equal and reactive to light, neck supple Cardiovascular: Normal rate, regular rhythm and normal heart sounds.  No murmur heard. No BLE edema. Pulmonary/Chest: Effort normal and breath sounds normal. No respiratory distress. Abdominal: Soft.  There is no tenderness. MSK: left lower back tightness, straight leg test performed and elicited pain Psychiatric: Patient has a normal mood and affect. behavior is normal. Judgment and thought content normal.  Results for orders placed or performed in visit on 03/13/21  Lipid panel  Result Value Ref Range   Cholesterol, Total CANCELED mg/dL   Triglycerides CANCELED    HDL CANCELED    VLDL Cholesterol Cal CANCELED mg/dL  POCT Glucose (CBG)-Manual entry (CPT [phone])  Result Value Ref Range   POC Glucose 107 (A) 70 - 99 mg/dl      Assessment & Plan:   Problem List Items Addressed This Visit   None Visit Diagnoses     Acute left-sided low back pain  without sciatica    -  Primary   start flexeril, continue tylenol, can use heat thearpy, exercises proveded on mychart. if no improvement will get lumbar xray and refer to physcal therapy   Relevant Medications   cyclobenzaprine (FLEXERIL) 10 MG tablet        Follow up plan: Return if symptoms worsen or fail to improve.

## 2023-08-04 ENCOUNTER — Encounter: Payer: Self-pay | Admitting: Physician Assistant

## 2023-08-04 ENCOUNTER — Ambulatory Visit: Payer: Managed Care, Other (non HMO) | Admitting: Physician Assistant

## 2023-08-04 VITALS — BP 128/72 | HR 85 | Temp 98.4°F | Resp 16 | Ht 68.0 in | Wt 289.9 lb

## 2023-08-04 DIAGNOSIS — M79652 Pain in left thigh: Secondary | ICD-10-CM | POA: Diagnosis not present

## 2023-08-04 NOTE — Patient Instructions (Signed)
I recommend the following at this time to help relieve that discomfort:  Rest Warm compresses to the area (20 minutes on, minimum of 30 minutes off) You can alternate Tylenol and Ibuprofen for pain management but Ibuprofen is typically preferred to reduce inflammation.  Tylenol is preferred due to your kidney function Gentle stretches and exercises that I have included in your paperwork Try to reduce excess strain to the area and rest as much as possible  Wear supportive shoes and, if you must lift anything, use proper lifting techniques that spare your back.   If these measures do not lead to improvement in your symptoms over the next 2-4 weeks please let us know

## 2023-08-04 NOTE — Progress Notes (Signed)
Acute Office Visit   Patient: Thomas Durham   DOB: [birth year]   62 y.o. Male  MRN: [id number] Visit Date: 08/04/2023  Today's healthcare provider: Oswaldo Conroy Eulene Pekar, PA-C  Introduced myself to the patient as a Secondary school teacher and provided education on APPs in clinical practice.    Chief Complaint  Patient presents with   Tingling     Onset for 2 months Left thigh, pt can feel it more while being on a recliner or in bed.   Subjective    HPI HPI     Tingling    Additional comments:  Onset for 2 months Left thigh, pt can feel it more while being on a recliner or in bed.      Last edited by Forde Radon, CMA on 08/04/2023 10:26 AM.       Left thigh pain/tingling   Onset: gradual  Duration: ongoing for 2 months  Location: left lateral thigh  Radiation: none  Pain level and character: reports burning and tingling, reports 5/10 severity  Other associated symptoms: reports some swelling.  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   lisinopril (ZESTRIL) 20 MG tablet 1 tablet Orally Once a day for 90 days   metFORMIN (GLUCOPHAGE-XR) 500 MG 24 hr tablet Take 500 mg by mouth 2 (two) times daily with a meal.   sildenafil (VIAGRA) 100 MG tablet Take 0.5-1 tablets (50-100 mg  total) by mouth daily as needed for erectile dysfunction.   cyclobenzaprine (FLEXERIL) 10 MG tablet Take 1 tablet (10 mg total) by mouth 3 (three) times daily as needed for muscle spasms. (Patient not taking: Reported on 08/04/2023)   No facility-administered medications prior to visit.    Review of Systems  Respiratory:  Negative for chest tightness, shortness of breath and wheezing.   Cardiovascular:  Negative for chest pain and palpitations.  Musculoskeletal:  Positive for myalgias.  Neurological:  Positive for numbness. Negative for tremors and weakness.        Objective    BP 128/72   Pulse 85   Temp 98.4 F (36.9 C) (Oral)   Resp 16   Ht 5\' 8"  (1.727 m)   Wt 289 lb 14.4 oz (131.5 kg)   SpO2 96%   BMI 44.08 kg/m     Physical Exam Vitals reviewed.  Constitutional:      General: He is awake.     Appearance: Normal appearance. He is well-developed and well-groomed.  HENT:     Head: Normocephalic and atraumatic.  Pulmonary:     Effort: Pulmonary effort is normal.  Musculoskeletal:     Left upper leg: No edema, deformity, lacerations or tenderness.  Legs:     Comments: Strength is 5/5 with regards to hip flexion, abduction, adduction, dorsiflexion, plantarflexion    Neurological:     Mental Status: He is alert.  Psychiatric:        Behavior: Behavior is cooperative.       No results found for any visits on 08/04/23.  Assessment & Plan      No follow-ups on file.       Problem List Items Addressed This Visit   None Visit Diagnoses     Left thigh pain    -  Primary Acute on chronic, ongoing Patient reports numbness and tingling along the lateral aspect of left thigh He reports that previous to left thigh burning and tingling he did have left-sided low back pain that has largely resolved with weight loss Given HPI, reassuring physical exam I suspect that left-sided pain is likely secondary to sciatic nerve involvement from low back  pain Recommend using Tylenol as needed for pain relief.  We discussed that he should probably not use NSAIDs due to singular kidney Provided stretches and exercises were negative to assist with management If symptoms or not improving in the next 1 to 2 months recommend follow-up and potential MRI Follow-up as needed for progressing or persistent symptoms        No follow-ups on file.   I, Olyver Hawes E Reagen Haberman, PA-C, have reviewed all documentation for this visit. The documentation on 08/04/23 for the exam, diagnosis, procedures, and orders are all accurate and complete.   Jacquelin Hawking, MHS, PA-C [facility]

## 2023-10-19 ENCOUNTER — Inpatient Hospital Stay: Admission: RE | Admit: 2023-10-19 | Payer: Managed Care, Other (non HMO) | Source: Ambulatory Visit

## 2024-01-15 ENCOUNTER — Encounter: Payer: Self-pay | Admitting: Physician Assistant

## 2024-01-15 NOTE — Progress Notes (Signed)
   There were no vitals taken for this visit.   Subjective:    Patient ID: Thomas Durham, male    DOB: Nov 25, 1960, 63 y.o.   MRN: [id number]  HPI: Thomas Durham is a 63 y.o. male  No chief complaint on file.   Discussed the use of AI scribe software for clinical note transcription with the patient, who gave verbal consent to proceed.  History of Present Illness          08/04/2023   10:22 AM 03/06/2023    1:29 PM 11/21/2022    3:36 PM  Depression screen PHQ 2/9  Decreased Interest 0 0 0  Down, Depressed, Hopeless 0 0 0  PHQ - 2 Score 0 0 0  Altered sleeping 0    Tired, decreased energy 0    Change in appetite 0    Feeling bad or failure about yourself  0    Trouble concentrating 0    Moving slowly or fidgety/restless 0    Suicidal thoughts 0    PHQ-9 Score 0    Difficult doing work/chores Not difficult at all      Relevant past medical, surgical, family and social history reviewed and updated as indicated. Interim medical history since our last visit reviewed. Allergies and medications reviewed and updated.  Review of Systems  Per HPI unless specifically indicated above     Objective:    There were no vitals taken for this visit.  {Vitals History (Optional):23777} Wt Readings from Last 3 Encounters:  08/04/23 289 lb 14.4 oz (131.5 kg)  03/06/23 (!) 303 lb 12.8 oz (137.8 kg)  11/21/22 (!) 302 lb 3.2 oz (137.1 kg)    Physical Exam Physical Exam    Results for orders placed or performed in visit on 03/13/21  POCT Glucose (CBG)-Manual entry (CPT 82948)   Collection Time: 03/13/21  4:24 PM  Result Value Ref Range   POC Glucose 107 (A) 70 - 99 mg/dl  Lipid panel   Collection Time: 03/13/21  4:25 PM  Result Value Ref Range   Cholesterol, Total CANCELED mg/dL   Triglycerides CANCELED    HDL CANCELED    VLDL Cholesterol Cal CANCELED mg/dL   {Labs (WUJWJXBJ):47829}    Assessment & Plan:   Problem List Items Addressed This Visit   None     Assessment and Plan Assessment & Plan         Follow up plan: No follow-ups on file.

## 2024-01-16 ENCOUNTER — Encounter: Payer: Self-pay | Admitting: Nurse Practitioner

## 2024-01-16 ENCOUNTER — Ambulatory Visit: Admitting: Nurse Practitioner

## 2024-01-16 ENCOUNTER — Telehealth: Payer: Self-pay

## 2024-01-16 ENCOUNTER — Other Ambulatory Visit: Payer: Self-pay

## 2024-01-16 VITALS — BP 130/70 | HR 89 | Temp 98.1°F | Resp 16 | Ht 68.0 in | Wt 303.6 lb

## 2024-01-16 DIAGNOSIS — R7303 Prediabetes: Secondary | ICD-10-CM | POA: Diagnosis not present

## 2024-01-16 DIAGNOSIS — Z85528 Personal history of other malignant neoplasm of kidney: Secondary | ICD-10-CM

## 2024-01-16 DIAGNOSIS — E66813 Obesity, class 3: Secondary | ICD-10-CM | POA: Diagnosis not present

## 2024-01-16 DIAGNOSIS — I10 Essential (primary) hypertension: Secondary | ICD-10-CM | POA: Insufficient documentation

## 2024-01-16 DIAGNOSIS — R739 Hyperglycemia, unspecified: Secondary | ICD-10-CM | POA: Diagnosis not present

## 2024-01-16 DIAGNOSIS — Z6841 Body Mass Index (BMI) 40.0 and over, adult: Secondary | ICD-10-CM

## 2024-01-16 DIAGNOSIS — G4730 Sleep apnea, unspecified: Secondary | ICD-10-CM

## 2024-01-16 LAB — POCT GLYCOSYLATED HEMOGLOBIN (HGB A1C): Hemoglobin A1C: 6 % — AB (ref 4.0–5.6)

## 2024-01-16 MED ORDER — TIRZEPATIDE 2.5 MG/0.5ML ~~LOC~~ SOAJ
2.5000 mg | SUBCUTANEOUS | 0 refills | Status: DC
Start: 2024-01-16 — End: 2024-01-21

## 2024-01-16 NOTE — Telephone Encounter (Signed)
 Prior Auth for   tirzepatide [facility]) 2.5 MG/0.5ML Pen    Key [id number]

## 2024-01-16 NOTE — Telephone Encounter (Signed)
 PA started

## 2024-01-19 ENCOUNTER — Encounter (INDEPENDENT_AMBULATORY_CARE_PROVIDER_SITE_OTHER): Payer: Self-pay

## 2024-01-21 ENCOUNTER — Other Ambulatory Visit: Payer: Self-pay | Admitting: Nurse Practitioner

## 2024-01-21 DIAGNOSIS — Z85528 Personal history of other malignant neoplasm of kidney: Secondary | ICD-10-CM

## 2024-01-21 DIAGNOSIS — G4730 Sleep apnea, unspecified: Secondary | ICD-10-CM

## 2024-01-21 DIAGNOSIS — R7303 Prediabetes: Secondary | ICD-10-CM

## 2024-01-21 DIAGNOSIS — R739 Hyperglycemia, unspecified: Secondary | ICD-10-CM

## 2024-01-21 MED ORDER — WEGOVY 0.25 MG/0.5ML ~~LOC~~ SOAJ: 0.2500 mg | SUBCUTANEOUS | 0 refills | Status: DC

## 2024-05-03 ENCOUNTER — Other Ambulatory Visit: Payer: Self-pay | Admitting: Physician Assistant

## 2024-05-03 ENCOUNTER — Ambulatory Visit
Admission: RE | Admit: 2024-05-03 | Discharge: 2024-05-03 | Disposition: A | Discharge status: Home / Self Care | Source: Ambulatory Visit | Attending: Physician Assistant | Admitting: Physician Assistant

## 2024-05-03 DIAGNOSIS — R0689 Other abnormalities of breathing: Secondary | ICD-10-CM

## 2024-07-20 LAB — OPHTHALMOLOGY REPORT-SCANNED

## 2024-09-19 ENCOUNTER — Encounter: Payer: Self-pay | Admitting: Emergency Medicine

## 2024-09-19 ENCOUNTER — Ambulatory Visit: Admission: EM | Admit: 2024-09-19 | Discharge: 2024-09-19 | Disposition: A | Discharge status: Home / Self Care

## 2024-09-19 DIAGNOSIS — R051 Acute cough: Secondary | ICD-10-CM

## 2024-09-19 DIAGNOSIS — J209 Acute bronchitis, unspecified: Secondary | ICD-10-CM | POA: Diagnosis not present

## 2024-09-19 LAB — POC SOFIA SARS ANTIGEN FIA: SARS Coronavirus 2 Ag: NEGATIVE

## 2024-09-19 MED ORDER — PROMETHAZINE-DM 6.25-15 MG/5ML PO SYRP: 5.0000 mL | ORAL_SOLUTION | Freq: Four times a day (QID) | ORAL | 0 refills | Status: AC | PRN

## 2024-09-19 MED ORDER — AEROCHAMBER MV MISC: 2 refills | Status: AC

## 2024-09-19 MED ORDER — AZITHROMYCIN 250 MG PO TABS: 250.0000 mg | ORAL_TABLET | Freq: Every day | ORAL | 0 refills | Status: AC

## 2024-09-19 MED ORDER — METHYLPREDNISOLONE 4 MG PO TBPK: ORAL_TABLET | ORAL | 0 refills | Status: AC

## 2024-09-19 MED ORDER — IPRATROPIUM BROMIDE 0.06 % NA SOLN: 2.0000 | Freq: Four times a day (QID) | NASAL | 12 refills | Status: AC

## 2024-09-19 MED ORDER — BENZONATATE 100 MG PO CAPS: 200.0000 mg | ORAL_CAPSULE | Freq: Three times a day (TID) | ORAL | 0 refills | Status: AC

## 2024-09-19 MED ORDER — ALBUTEROL SULFATE HFA 108 (90 BASE) MCG/ACT IN AERS: 2.0000 | INHALATION_SPRAY | RESPIRATORY_TRACT | 0 refills | Status: AC | PRN

## 2024-09-19 NOTE — ED Triage Notes (Signed)
 Patient c/o cough and chest congestion that started on Wed. Patient reports some SOB that started 2 days ago and worse at night.  Patient denies fevers.

## 2024-09-19 NOTE — Discharge Instructions (Addendum)
 Take the azithromycin  once daily for 5 days for treatment of your bronchitis.  Starting tomorrow morning start taking the Medrol Dosepak according to the package instructions.  This will decrease airway inflammation and help improve your breathing.  Use the albuterol  inhaler, with the spacer, and take 1 to 2 puffs every 4-6 hours as needed for shortness of breath or wheezing.Use the Atrovent nasal spray, 2 squirts in each nostril every 6 hours, as needed for runny nose and postnasal drip.  Use the Tessalon Perles every 8 hours during the day.  Take them with a small sip of water.  They may give you some numbness to the base of your tongue or a metallic taste in your mouth, this is normal.  Use the Promethazine DM cough syrup at bedtime for cough and congestion.  It will make you drowsy so do not take it during the day.  Return for reevaluation or see your primary care provider for any new or worsening symptoms.

## 2024-09-19 NOTE — ED Provider Notes (Signed)
 [facility]    CSN: [id number] Arrival date & time: 09/19/24  9062      History   Chief Complaint Chief Complaint  Patient presents with   Cough    HPI Thomas Durham is a 63 y.o. male.   HPI  63 year old male with past medical history significant for left renal carcinoma, obesity, and essential hypertension presents for evaluation of respiratory symptoms that started 4 days ago with runny nose, nasal congestion, and cough.  The cough is worse when he lays down.  Approximately 2 days ago he developed shortness of breath and wheezing.  He denies any fever, ear pain, sore throat.  No known sick contacts or recent travel out of state or country.  Past Medical History:  Diagnosis Date   Cancer (HCC)    left kidney   Obesity    Ruptured globe     Patient Active Problem List   Diagnosis Date Noted   Benign essential HTN 01/16/2024   Class 3 severe obesity due to excess calories with serious comorbidity and body mass index (BMI) of 40.0 to 44.9 in adult [facility]) 01/16/2024   Prediabetes 01/16/2024   Morbid obesity (HCC) 11/11/2018   Hx of renal cell carcinoma 11/11/2018   High risk medication use 07/07/2018   Examination of participant in clinical trial 01/19/2018    Past Surgical History:  Procedure Laterality Date   COLONOSCOPY WITH PROPOFOL  N/A 11/05/2019   Procedure: COLONOSCOPY WITH PROPOFOL ;  Surgeon: Dessa Reyes ORN, MD;  Location: [facility] END[facility]COPY;  Service: Endoscopy;  Laterality: N/A;   EYE SURGERY  2015-16   x5   removal of kidney Left    due to cancer   TONSILLECTOMY  1977   TOTAL HIP ARTHROPLASTY  2014       Home Medications    Prior to Admission medications   Medication Sig Start Date End Date Taking? Authorizing Provider  albuterol  (VENTOLIN  HFA) 108 (90 Base) MCG/ACT inhaler Inhale 2 puffs into the lungs every 4 (four) hours as needed. 09/19/24  Yes Bernardino Ditch, NP  azithromycin  (ZITHROMAX  Z-PAK) 250 MG tablet Take 1 tablet (250 mg  total) by mouth daily. Take 2 tablets on the first day and then 1 tablet daily thereafter for a total of 5 days of treatment. 09/19/24  Yes Bernardino Ditch, NP  benzonatate (TESSALON) 100 MG capsule Take 2 capsules (200 mg total) by mouth every 8 (eight) hours. 09/19/24  Yes Bernardino Ditch, NP  ipratropium (ATROVENT) 0.06 % nasal spray Place 2 sprays into both nostrils 4 (four) times daily. 09/19/24  Yes Bernardino Ditch, NP  methylPREDNISolone (MEDROL DOSEPAK) 4 MG TBPK tablet Take according to the package insert. 09/19/24  Yes Bernardino Ditch, NP  MOUNJARO  5 MG/0.5ML Pen SMARTSIG:0.5 Milliliter(s) SUB-Q Once a Week 09/07/24  Yes [provider]  promethazine-dextromethorphan (PROMETHAZINE-DM) 6.25-15 MG/5ML syrup Take 5 mLs by mouth 4 (four) times daily as needed. 09/19/24  Yes Bernardino Ditch, NP  Spacer/Aero-Holding Chambers (AEROCHAMBER MV) inhaler Use as instructed 09/19/24  Yes Bernardino Ditch, NP  amLODipine (NORVASC) 5 MG tablet Take 5 mg by mouth daily.    [provider]  lisinopril (ZESTRIL) 20 MG tablet 1 tablet Orally Once a day for 90 days 10/25/22   [provider]  metFORMIN (GLUCOPHAGE-XR) 500 MG 24 hr tablet Take 500 mg by mouth 2 (two) times daily with a meal. 05/23/23   [provider]  sildenafil  (VIAGRA ) 100 MG tablet Take 0.5-1 tablets (50-100 mg total) by mouth daily  as needed for erectile dysfunction. 05/14/18   Hinton Newell SQUIBB, MD    Family History Family History  Problem Relation Age of Onset   Cancer Mother    Lung cancer Mother    Bladder Cancer Mother    Hypertension Father    Stroke Father    Cancer Brother    Lung cancer Brother    Prostate cancer Neg Hx    Kidney cancer Neg Hx     Social History Social History   Tobacco Use   Smoking status: Never   Smokeless tobacco: Never   Tobacco comments:    patient's wife vapes  Vaping Use   Vaping status: Never Used  Substance Use Topics   Alcohol use: Yes    Alcohol/week: 12.0 - 15.0  standard drinks of alcohol    Types: 12 - 15 Cans of beer per week   Drug use: No     Allergies   Amoxicillin and Povidone iodine   Review of Systems Review of Systems  Constitutional:  Negative for fever.  HENT:  Positive for congestion, postnasal drip and rhinorrhea. Negative for ear pain and sore throat.   Respiratory:  Positive for cough, shortness of breath and wheezing.      Physical Exam Triage Vital Signs ED Triage Vitals  Encounter Vitals Group     BP      Girls Systolic BP Percentile      Girls Diastolic BP Percentile      Boys Systolic BP Percentile      Boys Diastolic BP Percentile      Pulse      Resp      Temp      Temp src      SpO2      Weight      Height      Head Circumference      Peak Flow      Pain Score      Pain Loc      Pain Education      Exclude from Growth Chart    No data found.  Updated Vital Signs BP 124/74 (BP Location: Left Arm)   Pulse 92   Temp 98.4 F (36.9 C) (Oral)   Resp 16   Ht 5' 8 (1.727 m)   Wt (!) 303 lb 9.2 oz (137.7 kg)   SpO2 96%   BMI 46.16 kg/m   Visual Acuity Right Eye Distance:   Left Eye Distance:   Bilateral Distance:    Right Eye Near:   Left Eye Near:    Bilateral Near:     Physical Exam Vitals and nursing note reviewed.  Constitutional:      Appearance: Normal appearance. He is not ill-appearing.  HENT:     Head: Normocephalic and atraumatic.     Right Ear: Tympanic membrane, ear canal and external ear normal. There is no impacted cerumen.     Left Ear: Tympanic membrane, ear canal and external ear normal. There is no impacted cerumen.     Nose: Congestion and rhinorrhea present.     Comments: Nasal mucosa is edematous and erythematous with clear discharge in both nares.    Mouth/Throat:     Mouth: Mucous membranes are moist.     Pharynx: Oropharynx is clear. Posterior oropharyngeal erythema present. No oropharyngeal exudate.     Comments: Erythema to the posterior pharynx with clear  postnasal drip. Cardiovascular:     Rate and Rhythm: Normal rate and regular rhythm.  Pulses: Normal pulses.     Heart sounds: Normal heart sounds. No murmur heard.    No friction rub. No gallop.  Pulmonary:     Effort: Pulmonary effort is normal.     Breath sounds: Wheezing and rhonchi present. No rales.     Comments: Diffuse expiratory wheezing and rhonchi in the middle lung fields. Musculoskeletal:     Cervical back: Normal range of motion and neck supple. No tenderness.  Lymphadenopathy:     Cervical: No cervical adenopathy.  Skin:    General: Skin is warm and dry.     Capillary Refill: Capillary refill takes less than 2 seconds.     Findings: No rash.  Neurological:     General: No focal deficit present.     Mental Status: He is alert and oriented to person, place, and time.      UC Treatments / Results  Labs (all labs ordered are listed, but only abnormal results are displayed) Labs Reviewed  POC SOFIA SARS ANTIGEN FIA - Normal    EKG   Radiology No results found.  Procedures Procedures (including critical care time)  Medications Ordered in UC Medications - No data to display  Initial Impression / Assessment and Plan / UC Course  I have reviewed the triage vital signs and the nursing notes.  Pertinent labs & imaging results that were available during my care of the patient were reviewed by me and considered in my medical decision making (see chart for details).   Patient is a nontoxic-appearing 63 year old male presenting for evaluation of 4 days with respiratory symptoms as outlined in the HPI above.  He reports that he has been having to sleep in a recliner because every time he lays back and triggers a cough..  He does have mild dyspnea when talking in the exam room.  His cardiopulmonary exam reveals diffuse expiratory wheezing with rhonchi in his middle lung fields bilaterally.  Differential diagnosis includes COVID, influenza, and bronchitis.  Due to the  fact that he has had symptoms for 4 days I will not test him for influenza at this time but I will order a COVID antigen test.  COVID antigen test is negative.  I will discharge patient on the diagnosis of bronchitis.  I will start her on azithromycin  once daily for 5 days for treatment of the bronchitis.  I will also prescribe an albuterol  inhaler and spacer and he can do 1 to 2 puffs every 4-6 hours as needed for shortness of breath or wheezing.  Atrovent nasal spray for his nasal congestion and Tessalon Perles and Promethazine DM cough syrup for cough and congestion.  Additionally, I will discharge him home on a Medrol Dosepak to help decrease pulmonary inflammation.  He may start that tomorrow morning.   Final Clinical Impressions(s) / UC Diagnoses   Final diagnoses:  Acute cough  Acute bronchitis, unspecified organism     Discharge Instructions      Take the azithromycin  once daily for 5 days for treatment of your bronchitis.  Starting tomorrow morning start taking the Medrol Dosepak according to the package instructions.  This will decrease airway inflammation and help improve your breathing.  Use the albuterol  inhaler, with the spacer, and take 1 to 2 puffs every 4-6 hours as needed for shortness of breath or wheezing.Use the Atrovent nasal spray, 2 squirts in each nostril every 6 hours, as needed for runny nose and postnasal drip.  Use the Tessalon Perles every 8 hours during the  day.  Take them with a small sip of water.  They may give you some numbness to the base of your tongue or a metallic taste in your mouth, this is normal.  Use the Promethazine DM cough syrup at bedtime for cough and congestion.  It will make you drowsy so do not take it during the day.  Return for reevaluation or see your primary care provider for any new or worsening symptoms.      ED Prescriptions     Medication Sig Dispense Auth. Provider   Spacer/Aero-Holding Chambers (AEROCHAMBER MV) inhaler  Use as instructed 1 each Bernardino Ditch, NP   albuterol  (VENTOLIN  HFA) 108 (90 Base) MCG/ACT inhaler Inhale 2 puffs into the lungs every 4 (four) hours as needed. 18 g Bernardino Ditch, NP   azithromycin  (ZITHROMAX  Z-PAK) 250 MG tablet Take 1 tablet (250 mg total) by mouth daily. Take 2 tablets on the first day and then 1 tablet daily thereafter for a total of 5 days of treatment. 6 tablet Bernardino Ditch, NP   benzonatate (TESSALON) 100 MG capsule Take 2 capsules (200 mg total) by mouth every 8 (eight) hours. 21 capsule Bernardino Ditch, NP   ipratropium (ATROVENT) 0.06 % nasal spray Place 2 sprays into both nostrils 4 (four) times daily. 15 mL Bernardino Ditch, NP   methylPREDNISolone (MEDROL DOSEPAK) 4 MG TBPK tablet Take according to the package insert. 1 each Bernardino Ditch, NP   promethazine-dextromethorphan (PROMETHAZINE-DM) 6.25-15 MG/5ML syrup Take 5 mLs by mouth 4 (four) times daily as needed. 118 mL Bernardino Ditch, NP      PDMP not reviewed this encounter.   Bernardino Ditch, NP 09/19/24 1047
